# Patient Record
Sex: Male | Born: 1970 | Race: Black or African American | Hispanic: No | Marital: Single | Smoking: Current every day smoker
Health system: Southern US, Community
[De-identification: ages and names within clinical notes are randomized; demographics above are authoritative.]

## PROBLEM LIST (undated history)

## (undated) DIAGNOSIS — W3400XA Accidental discharge from unspecified firearms or gun, initial encounter: Secondary | ICD-10-CM

## (undated) DIAGNOSIS — H547 Unspecified visual loss: Secondary | ICD-10-CM

## (undated) HISTORY — PX: COLON SURGERY: SHX602

---

## 2010-08-01 ENCOUNTER — Emergency Department: Payer: Self-pay | Admitting: Emergency Medicine

## 2016-11-08 ENCOUNTER — Emergency Department: Payer: Medicaid Other

## 2016-11-08 ENCOUNTER — Encounter: Payer: Self-pay | Admitting: Emergency Medicine

## 2016-11-08 ENCOUNTER — Emergency Department
Admission: EM | Admit: 2016-11-08 | Discharge: 2016-11-09 | Disposition: A | Discharge status: Home / Self Care | Payer: Medicaid Other | Attending: Emergency Medicine | Admitting: Emergency Medicine

## 2016-11-08 DIAGNOSIS — R1084 Generalized abdominal pain: Secondary | ICD-10-CM | POA: Insufficient documentation

## 2016-11-08 DIAGNOSIS — R109 Unspecified abdominal pain: Secondary | ICD-10-CM | POA: Diagnosis present

## 2016-11-08 DIAGNOSIS — R079 Chest pain, unspecified: Secondary | ICD-10-CM | POA: Diagnosis not present

## 2016-11-08 DIAGNOSIS — F17200 Nicotine dependence, unspecified, uncomplicated: Secondary | ICD-10-CM | POA: Insufficient documentation

## 2016-11-08 LAB — TROPONIN I: Troponin I: <0.03 ng/mL (ref <0.03)

## 2016-11-08 LAB — CBC
HEMATOCRIT: 43.2 % (ref 40.0–52.0)
Hemoglobin: 15.3 g/dL (ref 13.0–18.0)
MCH: 33.1 pg (ref 26.0–34.0)
MCHC: 35.5 g/dL (ref 32.0–36.0)
MCV: 93.2 fL (ref 80.0–100.0)
Platelets: 232 10*3/uL (ref 150–440)
RBC: 4.64 MIL/uL (ref 4.40–5.90)
RDW: 12.7 % (ref 11.5–14.5)
WBC: 11.3 10*3/uL — ABNORMAL HIGH (ref 3.8–10.6)

## 2016-11-08 LAB — LIPASE, BLOOD: LIPASE: 39 U/L (ref 11–51)

## 2016-11-08 MED ORDER — MORPHINE SULFATE (PF) 4 MG/ML IV SOLN
INTRAVENOUS | Status: AC
  Administered 2016-11-08: 2 mg
  Filled 2016-11-08: qty 1

## 2016-11-08 MED ORDER — MORPHINE SULFATE (PF) 2 MG/ML IV SOLN: 2.0000 mg | Freq: Once | INTRAVENOUS | Status: DC

## 2016-11-08 MED ORDER — ONDANSETRON HCL 4 MG/2ML IJ SOLN
4.0000 mg | Freq: Once | INTRAMUSCULAR | Status: AC
  Administered 2016-11-08: 4 mg via INTRAVENOUS

## 2016-11-08 MED ORDER — ONDANSETRON HCL 4 MG/2ML IJ SOLN
INTRAMUSCULAR | Status: AC
  Administered 2016-11-08: 4 mg via INTRAVENOUS
  Filled 2016-11-08: qty 2
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
## 2016-11-08 NOTE — ED Notes (Signed)
X RAY at bedside 

## 2016-11-08 NOTE — ED Triage Notes (Signed)
Pt arrived via ems with complaints of chest pain that began at 2230 tonight. EMS gave 324 mg of aspirin and 1 sublingual NTG. Pt it alert and oriented upon arrival.

## 2016-11-09 ENCOUNTER — Encounter: Payer: Self-pay | Admitting: Radiology

## 2016-11-09 ENCOUNTER — Emergency Department: Payer: Medicaid Other

## 2016-11-09 LAB — COMPREHENSIVE METABOLIC PANEL
ALBUMIN: 3.6 g/dL (ref 3.5–5.0)
ALT: 11 U/L — ABNORMAL LOW (ref 17–63)
ANION GAP: 7 (ref 5–15)
AST: 14 U/L — ABNORMAL LOW (ref 15–41)
Alkaline Phosphatase: 38 U/L (ref 38–126)
BUN: 14 mg/dL (ref 6–20)
CO2: 21 mmol/L — AB (ref 22–32)
Calcium: 9 mg/dL (ref 8.9–10.3)
Chloride: 108 mmol/L (ref 101–111)
Creatinine, Ser: 0.86 mg/dL (ref 0.61–1.24)
GFR calc Af Amer: >60 mL/min (ref >60)
GFR calc non Af Amer: >60 mL/min (ref >60)
GLUCOSE: 90 mg/dL (ref 65–99)
POTASSIUM: 3.8 mmol/L (ref 3.5–5.1)
SODIUM: 136 mmol/L (ref 135–145)
Total Bilirubin: 0.6 mg/dL (ref 0.3–1.2)
Total Protein: 7 g/dL (ref 6.5–8.1)

## 2016-11-09 LAB — TROPONIN I: Troponin I: <0.03 ng/mL (ref <0.03)

## 2016-11-09 MED ORDER — OXYCODONE-ACETAMINOPHEN 5-325 MG PO TABS: 1.0000 | ORAL_TABLET | Freq: Four times a day (QID) | ORAL | 0 refills | Status: DC | PRN

## 2016-11-09 MED ORDER — IOPAMIDOL (ISOVUE-370) INJECTION 76%
125.0000 mL | Freq: Once | INTRAVENOUS | Status: AC | PRN
  Administered 2016-11-09: 125 mL via INTRAVENOUS

## 2016-11-09 NOTE — ED Notes (Signed)
Patient asked for something to eat, and I took him a [employer] plannerphone charger cable since he uses it for his handicap.

## 2017-03-18 ENCOUNTER — Emergency Department
Admission: EM | Admit: 2017-03-18 | Discharge: 2017-03-18 | Disposition: A | Discharge status: Home / Self Care | Payer: Medicaid Other | Attending: Emergency Medicine | Admitting: Emergency Medicine

## 2017-03-18 ENCOUNTER — Emergency Department: Payer: Medicaid Other

## 2017-03-18 DIAGNOSIS — M25511 Pain in right shoulder: Secondary | ICD-10-CM | POA: Diagnosis present

## 2017-03-18 DIAGNOSIS — S4991XA Unspecified injury of right shoulder and upper arm, initial encounter: Secondary | ICD-10-CM

## 2017-03-18 DIAGNOSIS — F17200 Nicotine dependence, unspecified, uncomplicated: Secondary | ICD-10-CM | POA: Insufficient documentation

## 2017-03-18 MED ORDER — MORPHINE SULFATE (PF) 2 MG/ML IV SOLN
2.0000 mg | Freq: Once | INTRAVENOUS | Status: AC
  Administered 2017-03-18: 2 mg via INTRAVENOUS
  Filled 2017-03-18: qty 1

## 2017-03-18 MED ORDER — IBUPROFEN 800 MG PO TABS: 800.0000 mg | ORAL_TABLET | Freq: Three times a day (TID) | ORAL | 0 refills | Status: DC | PRN

## 2017-03-18 MED ORDER — ONDANSETRON HCL 4 MG/2ML IJ SOLN
4.0000 mg | Freq: Once | INTRAMUSCULAR | Status: AC
  Administered 2017-03-18: 4 mg via INTRAVENOUS
  Filled 2017-03-18: qty 2

## 2017-03-18 NOTE — ED Triage Notes (Signed)
Pt to ED from home via [facility] with c/o RIGHT shoulder pain following an injury while wrestling with his son. EMS reports no obvious deformity; CMS intact. Dr Manson PasseyBrown at bedside upon pt's arrival to ED. Pt is A&O, in NAD; RR even, regular, and unlabored.

## 2017-03-18 NOTE — ED Provider Notes (Signed)
[facility] Emergency Department Provider Note    First MD Initiated Contact with Patient 03/18/17 [phone]     (approximate)  I have reviewed the triage vital signs and the nursing notes.   HISTORY  Chief Complaint Shoulder Injury   HPI Jeremiah Bright is a 46 y.o. male S emergency department with 10 out of 10 right shoulder pain since 5 PM yesterday. Patient states that he was playing with his son who fell on top of him followed by resultant pain in his right shoulder. Patient states that the pain is worse with any movement 8 out of 10 without movement 10 out of 10 with movement. Patient denies any head injury no loss of consciousness.     Past medical history None  There are no active problems to display for this patient.   Past Surgical History:  Procedure Laterality Date  . COLON SURGERY      Prior to Admission medications   Medication Sig Start Date End Date Taking? Authorizing Provider  ibuprofen (ADVIL,MOTRIN) 800 MG tablet Take 1 tablet (800 mg total) by mouth every 8 (eight) hours as needed. 03/18/17   Darci CurrentBrown, [place] N, MD  oxyCODONE-acetaminophen (ROXICET) 5-325 MG tablet Take 1 tablet by mouth every 6 (six) hours as needed for severe pain. 11/09/16   Darci CurrentBrown, [place] N, MD    Allergies No known drug allergies  Family history  none  Social History Social History  Substance Use Topics  . Smoking status: Current Every Day Smoker  . Smokeless tobacco: Never Used  . Alcohol use Yes    Review of Systems Constitutional: No fever/chills Eyes: No visual changes. ENT: No sore throat. Cardiovascular: Denies chest pain. Respiratory: Denies shortness of breath. Gastrointestinal: No abdominal pain.  No nausea, no vomiting.  No diarrhea.  No constipation. Genitourinary: Negative for dysuria. Musculoskeletal: Negative for neck pain.  Negative for back pain.Positive for right shoulder pain Integumentary: Negative for rash. Neurological:  Negative for headaches, focal weakness or numbness.   ____________________________________________   PHYSICAL EXAM:  VITAL SIGNS: ED Triage Vitals  Enc Vitals Group     BP 03/18/17 0510 120/90     Pulse Rate 03/18/17 0510 73     Resp 03/18/17 0510 16     Temp 03/18/17 0510 98.5 F (36.9 C)     Temp Source 03/18/17 0510 Oral     SpO2 03/18/17 0510 97 %     Weight 03/18/17 0512 99.8 kg (220 lb)     Height 03/18/17 0512 1.88 m (6\' 2" )     Head Circumference --      Peak Flow --      Pain Score 03/18/17 0510 8     Pain Loc --      Pain Edu? --      Excl. in GC? --      Constitutional: Alert and oriented. Well appearing and in no acute distress. Eyes: Conjunctivae are normal. Head: Atraumatic. Mouth/Throat: Mucous membranes are moist.  Oropharynx non-erythematous. Neck: No stridor.   Cardiovascular: Normal rate, regular rhythm. Good peripheral circulation. Grossly normal heart sounds. Respiratory: Normal respiratory effort.  No retractions. Lungs CTAB. Gastrointestinal: Soft and nontender. No distention.  Musculoskeletal: Right shoulder  pain with active and passive range of motion.  Neurologic:  Normal speech and language. No gross focal neurologic deficits are appreciated.  Skin:  Skin is warm, dry and intact. No rash noted. Psychiatric: Mood and affect are normal. Speech and behavior are normal.   RADIOLOGY  I, [place] N Casha Estupinan, personally viewed and evaluated these images (plain radiographs) as part of my medical decision making, as well as reviewing the written report by the radiologist.  Dg Shoulder Right  Result Date: 03/18/2017 CLINICAL DATA:  Shoulder pain EXAM: RIGHT SHOULDER - 2+ VIEW COMPARISON:  Chest radiograph 11/08/2016 FINDINGS: There is no evidence of fracture or dislocation. Hypertrophic appearance of the distal right clavicle is unchanged. There is no evidence of arthropathy or other focal bone abnormality. Soft tissues are unremarkable. IMPRESSION: No  acute abnormality of the right shoulder. Electronically Signed   By: Deatra Robinson M.D.   On: 03/18/2017 05:42     Procedures   ____________________________________________   INITIAL IMPRESSION / ASSESSMENT AND PLAN / ED COURSE  Pertinent labs & imaging results that were available during my care of the patient were reviewed by me and considered in my medical decision making (see chart for details).  Patient given 2 mg of IV morphine on arrival to the emergency department secondary to pain. X-ray revealed no evidence of fracture or dislocation. On reevaluation patient able to AB duct is arm to approximately 120. Sling applied patient advised to follow-up with orthopedic surgery if pain does not improve.      ____________________________________________  FINAL CLINICAL IMPRESSION(S) / ED DIAGNOSES  Final diagnoses:  Right shoulder injury, initial encounter     MEDICATIONS GIVEN DURING THIS VISIT:  Medications  morphine 2 MG/ML injection 2 mg (2 mg Intravenous Given 03/18/17 0532)  ondansetron (ZOFRAN) injection 4 mg (4 mg Intravenous Given 03/18/17 0532)     NEW OUTPATIENT MEDICATIONS STARTED DURING THIS VISIT:  New Prescriptions   IBUPROFEN (ADVIL,MOTRIN) 800 MG TABLET    Take 1 tablet (800 mg total) by mouth every 8 (eight) hours as needed.    Modified Medications   No medications on file    Discontinued Medications   No medications on file     Note:  This document was prepared using Dragon voice recognition software and may include unintentional dictation errors.    Darci Current, MD 03/18/17 [phone]

## 2017-03-18 NOTE — ED Notes (Signed)
Pt arrived EMS and reports no friends or family available for transportation home. 2 bus passes given (1 for pt and 1 for his 48 y/o son).

## 2017-04-26 ENCOUNTER — Ambulatory Visit: Payer: Medicaid Other | Attending: Orthopedic Surgery | Admitting: Physical Therapy

## 2017-07-05 ENCOUNTER — Emergency Department (HOSPITAL_COMMUNITY)
Admission: EM | Admit: 2017-07-05 | Discharge: 2017-07-05 | Disposition: A | Discharge status: Home / Self Care | Payer: Medicaid Other | Attending: Emergency Medicine | Admitting: Emergency Medicine

## 2017-07-05 ENCOUNTER — Emergency Department (HOSPITAL_COMMUNITY): Payer: Medicaid Other

## 2017-07-05 ENCOUNTER — Encounter (HOSPITAL_COMMUNITY): Payer: Self-pay | Admitting: Emergency Medicine

## 2017-07-05 DIAGNOSIS — M7918 Myalgia, other site: Secondary | ICD-10-CM | POA: Diagnosis not present

## 2017-07-05 DIAGNOSIS — M79645 Pain in left finger(s): Secondary | ICD-10-CM | POA: Insufficient documentation

## 2017-07-05 DIAGNOSIS — F17200 Nicotine dependence, unspecified, uncomplicated: Secondary | ICD-10-CM | POA: Diagnosis not present

## 2017-07-05 HISTORY — DX: Unspecified visual loss: H54.7

## 2017-07-05 MED ORDER — IBUPROFEN 600 MG PO TABS: 600.0000 mg | ORAL_TABLET | Freq: Four times a day (QID) | ORAL | 0 refills | Status: DC | PRN
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
## 2017-07-05 NOTE — ED Provider Notes (Signed)
[facility]-EMERGENCY DEPT Provider Note   CSN: [id number] Arrival date & time: 07/05/17  1051     History   Chief Complaint Chief Complaint  Patient presents with  . Finger Injury    HPI Jeremiah Bright is a 46 y.o. male.  HPI  46 y.o. male, presents to the Emergency Department today due to left finger injuries. Notes on 3rd, 4th, and 5th digit. States that he got fingers stuck in a machine press 1 hour PTA. Fingers underneath for 30 seconds. Brought in by EMS. Fentanyl given en route. Denies numbness/tingling. No open wounds or bleeding. No deformities. No other symptoms noted.   Past Medical History:  Diagnosis Date  . Sight impaired     There are no active problems to display for this patient.   Past Surgical History:  Procedure Laterality Date  . COLON SURGERY         Home Medications    Prior to Admission medications   Medication Sig Start Date End Date Taking? Authorizing Provider  ibuprofen (ADVIL,MOTRIN) 800 MG tablet Take 1 tablet (800 mg total) by mouth every 8 (eight) hours as needed. 03/18/17   Darci CurrentBrown, Fayette N, MD  oxyCODONE-acetaminophen (ROXICET) 5-325 MG tablet Take 1 tablet by mouth every 6 (six) hours as needed for severe pain. 11/09/16   Darci CurrentBrown, Willits N, MD    Family History History reviewed. No pertinent family history.  Social History Social History  Substance Use Topics  . Smoking status: Current Every Day Smoker  . Smokeless tobacco: Never Used  . Alcohol use Yes     Allergies   Patient has no known allergies.   Review of Systems Review of Systems ROS reviewed and all are negative for acute change except as noted in the HPI.  Physical Exam Updated Vital Signs BP 118/80   Pulse (!) 58   Temp 97.7 F (36.5 C) (Oral)   Resp 16   SpO2 98%   Physical Exam  Constitutional: He is oriented to person, place, and time. Vital signs are normal. He appears well-developed and well-nourished.  HENT:  Head:  Normocephalic.  Right Ear: Hearing normal.  Left Ear: Hearing normal.  Eyes: Pupils are equal, round, and reactive to light. Conjunctivae and EOM are normal.  Cardiovascular: Normal rate and regular rhythm.   Pulmonary/Chest: Effort normal.  Musculoskeletal:  Left 3, 4, 5 digits without deformity. NVI. Grip strength intact. Able to close fist. Mild superficial abrasion noted.   Neurological: He is alert and oriented to person, place, and time.  Skin: Skin is warm and dry.  Psychiatric: He has a normal mood and affect. His speech is normal and behavior is normal. Thought content normal.  Nursing note and vitals reviewed.    ED Treatments / Results  Labs (all labs ordered are listed, but only abnormal results are displayed) Labs Reviewed - No data to display  EKG  EKG Interpretation None       Radiology Dg Finger Little Left  Result Date: 07/05/2017 CLINICAL DATA:  Crush injury EXAM: LEFT LITTLE FINGER 2+V COMPARISON:  None. FINDINGS: There is no evidence of fracture or dislocation. There is no evidence of arthropathy or other focal bone abnormality. Soft tissues are unremarkable. IMPRESSION: Negative. Electronically Signed   By: Charlett NoseKevin  Dover M.D.   On: 07/05/2017 11:44   Dg Finger Middle Left  Result Date: 07/05/2017 CLINICAL DATA:  Finger injury. EXAM: LEFT MIDDLE FINGER 2+V COMPARISON:  None. FINDINGS: There is no evidence of fracture  or dislocation. There is no evidence of arthropathy or other focal bone abnormality. Soft tissues are unremarkable. IMPRESSION: Negative. Electronically Signed   By: Charlett Nose M.D.   On: 07/05/2017 11:43   Dg Finger Ring Left  Result Date: 07/05/2017 CLINICAL DATA:  Crush injury EXAM: LEFT RING FINGER 2+V COMPARISON:  None. FINDINGS: Degenerative joint disease changes at the left ring finger DIP joint with apparent fusion across the joint space. No fracture, subluxation or dislocation. IMPRESSION: Degenerative changes and apparent fusion  across the left ring finger DIP joint. No acute bony abnormality. Electronically Signed   By: Charlett Nose M.D.   On: 07/05/2017 11:44    Procedures Procedures (including critical care time)  Medications Ordered in ED Medications - No data to display   Initial Impression / Assessment and Plan / ED Course  I have reviewed the triage vital signs and the nursing notes.  Pertinent labs & imaging results that were available during my care of the patient were reviewed by me and considered in my medical decision making (see chart for details).  Final Clinical Impressions(s) / ED Diagnoses   {I have reviewed and evaluated the relevant imaging studies.  {I have reviewed the relevant previous healthcare records.  {I obtained HPI from historian.   ED Course:  Assessment: Patient X-Ray negative for obvious fracture or dislocation. Likely contusion. Pt advised to follow up with PCP. Conservative therapy recommended and discussed. Patient will be discharged home & is agreeable with above plan. Returns precautions discussed. Pt appears safe for discharge  Disposition/Plan:  DC Home Additional Verbal discharge instructions given and discussed with patient.  Pt Instructed to f/u with PCP in the next week for evaluation and treatment of symptoms. Return precautions given Pt acknowledges and agrees with plan  Supervising Physician Bethann Berkshire, MD  Final diagnoses:  Pain of finger of left hand  Musculoskeletal pain    New Prescriptions New Prescriptions   No medications on file     Audry Pili, Cordelia Poche 07/05/17 1159    Bethann Berkshire, MD 07/06/17 [phone]

## 2017-07-05 NOTE — ED Notes (Signed)
Pt in xray

## 2017-07-05 NOTE — ED Notes (Signed)
Bed: [facility] Expected date:  Expected time:  Means of arrival:  Comments: EMS-finger injury 

## 2017-07-05 NOTE — ED Notes (Signed)
ED Provider at bedside. 

## 2019-02-07 ENCOUNTER — Emergency Department
Admission: EM | Admit: 2019-02-07 | Discharge: 2019-02-07 | Disposition: A | Discharge status: Home / Self Care | Payer: Medicaid Other | Attending: Emergency Medicine | Admitting: Emergency Medicine

## 2019-02-07 ENCOUNTER — Other Ambulatory Visit: Payer: Self-pay

## 2019-02-07 ENCOUNTER — Emergency Department: Payer: Medicaid Other

## 2019-02-07 DIAGNOSIS — R1010 Upper abdominal pain, unspecified: Secondary | ICD-10-CM | POA: Diagnosis not present

## 2019-02-07 DIAGNOSIS — F17200 Nicotine dependence, unspecified, uncomplicated: Secondary | ICD-10-CM | POA: Diagnosis not present

## 2019-02-07 DIAGNOSIS — R112 Nausea with vomiting, unspecified: Secondary | ICD-10-CM | POA: Diagnosis not present

## 2019-02-07 HISTORY — DX: Accidental discharge from unspecified firearms or gun, initial encounter: W34.00XA

## 2019-02-07 LAB — COMPREHENSIVE METABOLIC PANEL
ALT: 13 U/L (ref 0–44)
AST: 17 U/L (ref 15–41)
Albumin: 4.4 g/dL (ref 3.5–5.0)
Alkaline Phosphatase: 47 U/L (ref 38–126)
Anion gap: 10 (ref 5–15)
BUN: 14 mg/dL (ref 6–20)
CO2: 21 mmol/L — ABNORMAL LOW (ref 22–32)
Calcium: 9.3 mg/dL (ref 8.9–10.3)
Chloride: 110 mmol/L (ref 98–111)
Creatinine, Ser: 1.02 mg/dL (ref 0.61–1.24)
GFR calc Af Amer: >60 mL/min (ref >60)
GFR calc non Af Amer: >60 mL/min (ref >60)
Glucose, Bld: 121 mg/dL — ABNORMAL HIGH (ref 70–99)
Potassium: 3.5 mmol/L (ref 3.5–5.1)
Sodium: 141 mmol/L (ref 135–145)
Total Bilirubin: 0.4 mg/dL (ref 0.3–1.2)
Total Protein: 8 g/dL (ref 6.5–8.1)

## 2019-02-07 LAB — CBC
HCT: 51.6 % (ref 39.0–52.0)
Hemoglobin: 17.8 g/dL — ABNORMAL HIGH (ref 13.0–17.0)
MCH: 32.7 pg (ref 26.0–34.0)
MCHC: 34.5 g/dL (ref 30.0–36.0)
MCV: 94.7 fL (ref 80.0–100.0)
Platelets: 253 10*3/uL (ref 150–400)
RBC: 5.45 MIL/uL (ref 4.22–5.81)
RDW: 12.1 % (ref 11.5–15.5)
WBC: 11.3 10*3/uL — ABNORMAL HIGH (ref 4.0–10.5)
nRBC: 0 % (ref 0.0–0.2)

## 2019-02-07 LAB — LIPASE, BLOOD: Lipase: 33 U/L (ref 11–51)

## 2019-02-07 MED ORDER — IOHEXOL 300 MG/ML  SOLN
100.0000 mL | Freq: Once | INTRAMUSCULAR | Status: AC | PRN
  Administered 2019-02-07: 100 mL via INTRAVENOUS

## 2019-02-07 MED ORDER — SODIUM CHLORIDE 0.9% FLUSH: 3.0000 mL | Freq: Once | INTRAVENOUS | Status: DC

## 2019-02-07 MED ORDER — ONDANSETRON 4 MG PO TBDP
4.0000 mg | ORAL_TABLET | Freq: Once | ORAL | Status: AC
  Administered 2019-02-07: 4 mg via ORAL
  Filled 2019-02-07: qty 1

## 2019-02-07 MED ORDER — ONDANSETRON HCL 4 MG/2ML IJ SOLN
4.0000 mg | Freq: Once | INTRAMUSCULAR | Status: AC
  Administered 2019-02-07: 4 mg via INTRAVENOUS
  Filled 2019-02-07: qty 2

## 2019-02-07 MED ORDER — MORPHINE SULFATE (PF) 4 MG/ML IV SOLN
4.0000 mg | Freq: Once | INTRAVENOUS | Status: AC
  Administered 2019-02-07: 4 mg via INTRAVENOUS
  Filled 2019-02-07: qty 1

## 2019-02-07 NOTE — ED Triage Notes (Signed)
Pt c/o sudden onset mid abd pain with N/V that started a couple of hours ago. States he ate alfredo at work today. Pt actively vomiting in the WR>

## 2019-02-07 NOTE — ED Notes (Signed)
This RN to contact [employer] for transport home.

## 2019-02-07 NOTE — ED Notes (Signed)
Pt unable to contact ride at this time. States that he will try to contact another person for transport.
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
## 2019-12-03 ENCOUNTER — Ambulatory Visit: Payer: Medicaid Other | Attending: Internal Medicine

## 2019-12-03 DIAGNOSIS — Z23 Encounter for immunization: Secondary | ICD-10-CM

## 2019-12-03 NOTE — Progress Notes (Signed)
   Covid-19 Vaccination Clinic  Name:  Darrly Loberg    MRN: [id number] DOB: December 07, 1970  12/03/2019  Mr. Staniszewski was observed post Covid-19 immunization for 15 minutes without incident. He was provided with Vaccine Information Sheet and instruction to access the V-Safe system.   Mr. Mcphillips was instructed to call 911 with any severe reactions post vaccine: Marland Kitchen Difficulty breathing  . Swelling of face and throat  . A fast heartbeat  . A bad rash all over body  . Dizziness and weakness   Immunizations Administered    Name Date Dose VIS Date Route   Pfizer COVID-19 Vaccine 12/03/2019  9:39 AM 0.3 mL 08/24/2019 Intramuscular   Manufacturer: [employer], [employer]   Lot: [id number]   NDC: [address]-4

## 2019-12-31 ENCOUNTER — Ambulatory Visit: Payer: Medicaid Other | Attending: Internal Medicine

## 2019-12-31 DIAGNOSIS — Z23 Encounter for immunization: Secondary | ICD-10-CM

## 2019-12-31 NOTE — Progress Notes (Addendum)
At 0937 during the 15 minute post Covid 19 2nd vaccine observation, pt knelt to floor c/o headache. Pt was noted to be clammy and diaphoretic. Pt assisted back to the chair. Another nurse sent for VS monitor. 0938 BP 121/84. P105 RR21 pulse ox 100%. Pt stated he had no medical problems and no history of issues w/previous vaccine. Consulting civil engineer notified. Pt state he was thirsty and had not eat today. Given water and crackers. EMT, Minerva Areola came to evaluate pt. At 0949, Pt more uncomfortable, moving about seat, c/o right sided eye pain and headache that would not go away. He had eat a cookie and drank 2 20 oz waters. BP 142/113. P 87. Minerva Areola, EMT placed Jeremiah Bright in the wheelchair to allow him to lie down in the EMT observation area to rest. Charge RN notified. At 1047 after pt rested in quiet dark environment on stretcher under care of the [place] EMT, Minerva Areola, the pt felt well enough to walk to [employer] [place] waiting a the front where a driver was waiting and would escort him to work. Discharge instructions were given regarding signs/symptoms of when to call 911 for adverse reactions. The pt told the EMT he felt well enough and wanted to go to work. Charge RN reviewed the case and spoke to the pt and EMT.   Covid-19 Vaccination Clinic  Name:  Jeremiah Bright    MRN: [id number] DOB: 09-24-1970  12/31/2019  Jeremiah Bright was observed post Covid-19 immunization for 15 minutes .  During the observation period, he experienced an adverse reaction with the following symptoms:  headache.  Assessment : Time of assessment [phone]. Anxious, Diaphoretic and Clammy.  Actions taken:  Vitals sign taken  EMS on site and hand off completed to Paramedic (Name ERIC). VAERS form completed Allergy documented  There were no vitals filed for this visit. SEE ABove note for vital signs. Pt monitored BY EMS on stretcher for 60 minutes.  Medications administered: No medication administered.  Disposition: Reports no  further symptoms of adverse reaction after observation for 70 minutes. Discharged home. Instructed to call 911 for trouble breathing, rapid heart rate, dizziness, swelling of tongue or throat.   Immunizations Administered    Name Date Dose VIS Date Route   Pfizer COVID-19 Vaccine 12/31/2019  9:22 AM 0.3 mL 11/07/2018 Intramuscular   Manufacturer: [employer], [employer]   Lot: [id number]   NDC: [address]-2

## 2020-01-03 ENCOUNTER — Telehealth: Payer: Self-pay | Admitting: *Deleted

## 2020-01-03 NOTE — Telephone Encounter (Signed)
Called and spoke with patient in regards to his second COVID vaccine that he received on 12/31/2019. He stated that he felt fine the next day and the pain above his right eye had gone away. Advised to patient that now he is fully vaccinated he is ok and that when it comes time to get a COVID booster to please call his PCP or call our office with recommendations before hand. Patient verbalized understanding.

## 2022-01-24 ENCOUNTER — Observation Stay
Admission: EM | Admit: 2022-01-24 | Discharge: 2022-01-25 | Disposition: A | Discharge status: Home Health | Payer: Medicaid Other | Attending: Osteopathic Medicine | Admitting: Osteopathic Medicine

## 2022-01-24 ENCOUNTER — Emergency Department: Payer: Medicaid Other

## 2022-01-24 ENCOUNTER — Encounter: Payer: Self-pay | Admitting: Internal Medicine

## 2022-01-24 DIAGNOSIS — H547 Unspecified visual loss: Secondary | ICD-10-CM

## 2022-01-24 DIAGNOSIS — R7309 Other abnormal glucose: Secondary | ICD-10-CM | POA: Diagnosis present

## 2022-01-24 DIAGNOSIS — Z720 Tobacco use: Secondary | ICD-10-CM | POA: Diagnosis present

## 2022-01-24 DIAGNOSIS — Z20822 Contact with and (suspected) exposure to covid-19: Secondary | ICD-10-CM | POA: Insufficient documentation

## 2022-01-24 DIAGNOSIS — R0789 Other chest pain: Principal | ICD-10-CM | POA: Insufficient documentation

## 2022-01-24 DIAGNOSIS — F17210 Nicotine dependence, cigarettes, uncomplicated: Secondary | ICD-10-CM | POA: Diagnosis not present

## 2022-01-24 DIAGNOSIS — R079 Chest pain, unspecified: Secondary | ICD-10-CM | POA: Diagnosis present

## 2022-01-24 DIAGNOSIS — J441 Chronic obstructive pulmonary disease with (acute) exacerbation: Secondary | ICD-10-CM

## 2022-01-24 DIAGNOSIS — E876 Hypokalemia: Secondary | ICD-10-CM | POA: Diagnosis not present

## 2022-01-24 DIAGNOSIS — R0602 Shortness of breath: Secondary | ICD-10-CM | POA: Diagnosis present

## 2022-01-24 LAB — CBC WITH DIFFERENTIAL/PLATELET
Abs Immature Granulocytes: 0.02 10*3/uL (ref 0.00–0.07)
Basophils Absolute: 0 10*3/uL (ref 0.0–0.1)
Basophils Relative: 0 %
Eosinophils Absolute: 0 10*3/uL (ref 0.0–0.5)
Eosinophils Relative: 1 %
HCT: 45.1 % (ref 39.0–52.0)
Hemoglobin: 15.7 g/dL (ref 13.0–17.0)
Immature Granulocytes: 0 %
Lymphocytes Relative: 18 %
Lymphs Abs: 1.2 10*3/uL (ref 0.7–4.0)
MCH: 33.3 pg (ref 26.0–34.0)
MCHC: 34.8 g/dL (ref 30.0–36.0)
MCV: 95.8 fL (ref 80.0–100.0)
Monocytes Absolute: 0.3 10*3/uL (ref 0.1–1.0)
Monocytes Relative: 5 %
Neutro Abs: 5 10*3/uL (ref 1.7–7.7)
Neutrophils Relative %: 76 %
Platelets: 202 10*3/uL (ref 150–400)
RBC: 4.71 MIL/uL (ref 4.22–5.81)
RDW: 11.9 % (ref 11.5–15.5)
WBC: 6.6 10*3/uL (ref 4.0–10.5)
nRBC: 0 % (ref 0.0–0.2)

## 2022-01-24 LAB — BASIC METABOLIC PANEL
Anion gap: 6 (ref 5–15)
BUN: 10 mg/dL (ref 6–20)
CO2: 22 mmol/L (ref 22–32)
Calcium: 8.4 mg/dL — ABNORMAL LOW (ref 8.9–10.3)
Chloride: 110 mmol/L (ref 98–111)
Creatinine, Ser: 1.04 mg/dL (ref 0.61–1.24)
GFR, Estimated: >60 mL/min (ref >60)
Glucose, Bld: 128 mg/dL — ABNORMAL HIGH (ref 70–99)
Potassium: 3.3 mmol/L — ABNORMAL LOW (ref 3.5–5.1)
Sodium: 138 mmol/L (ref 135–145)

## 2022-01-24 LAB — TROPONIN I (HIGH SENSITIVITY)
Troponin I (High Sensitivity): 2 ng/L (ref <18)
Troponin I (High Sensitivity): 3 ng/L (ref <18)

## 2022-01-24 LAB — TSH: TSH: 0.172 u[IU]/mL — ABNORMAL LOW (ref 0.350–4.500)

## 2022-01-24 LAB — RESP PANEL BY RT-PCR (FLU A&B, COVID) ARPGX2
Influenza A by PCR: NEGATIVE
Influenza B by PCR: NEGATIVE
SARS Coronavirus 2 by RT PCR: NEGATIVE

## 2022-01-24 LAB — T4, FREE: Free T4: 1.16 ng/dL — ABNORMAL HIGH (ref 0.61–1.12)

## 2022-01-24 MED ORDER — POLYETHYLENE GLYCOL 3350 17 G PO PACK: 17.0000 g | PACK | Freq: Every day | ORAL | Status: DC | PRN

## 2022-01-24 MED ORDER — ACETAMINOPHEN 500 MG PO TABS
1000.0000 mg | ORAL_TABLET | Freq: Once | ORAL | Status: AC
  Administered 2022-01-24: 1000 mg via ORAL
  Filled 2022-01-24: qty 2

## 2022-01-24 MED ORDER — ONDANSETRON HCL 4 MG PO TABS: 4.0000 mg | ORAL_TABLET | Freq: Four times a day (QID) | ORAL | Status: DC | PRN

## 2022-01-24 MED ORDER — LACTATED RINGERS IV SOLN: INTRAVENOUS | Status: AC

## 2022-01-24 MED ORDER — POTASSIUM CHLORIDE CRYS ER 20 MEQ PO TBCR
20.0000 meq | EXTENDED_RELEASE_TABLET | Freq: Once | ORAL | Status: AC
  Administered 2022-01-24: 20 meq via ORAL
  Filled 2022-01-24: qty 1

## 2022-01-24 MED ORDER — BISACODYL 5 MG PO TBEC: 5.0000 mg | DELAYED_RELEASE_TABLET | Freq: Every day | ORAL | Status: DC | PRN

## 2022-01-24 MED ORDER — GUAIFENESIN ER 600 MG PO TB12: 600.0000 mg | ORAL_TABLET | Freq: Two times a day (BID) | ORAL | Status: DC | PRN

## 2022-01-24 MED ORDER — DOCUSATE SODIUM 100 MG PO CAPS
100.0000 mg | ORAL_CAPSULE | Freq: Two times a day (BID) | ORAL | Status: DC
  Filled 2022-01-24 (×2): qty 1

## 2022-01-24 MED ORDER — IPRATROPIUM-ALBUTEROL 0.5-2.5 (3) MG/3ML IN SOLN
3.0000 mL | Freq: Four times a day (QID) | RESPIRATORY_TRACT | Status: DC
  Administered 2022-01-24 – 2022-01-25 (×3): 3 mL via RESPIRATORY_TRACT
  Filled 2022-01-24 (×3): qty 3

## 2022-01-24 MED ORDER — NICOTINE 14 MG/24HR TD PT24
14.0000 mg | MEDICATED_PATCH | Freq: Every day | TRANSDERMAL | Status: DC
  Filled 2022-01-24: qty 1

## 2022-01-24 MED ORDER — ONDANSETRON HCL 4 MG/2ML IJ SOLN
4.0000 mg | Freq: Four times a day (QID) | INTRAMUSCULAR | Status: DC | PRN
Start: 2022-01-24 — End: 2022-01-25

## 2022-01-24 MED ORDER — ACETAMINOPHEN 325 MG RE SUPP: 650.0000 mg | Freq: Four times a day (QID) | RECTAL | Status: DC | PRN

## 2022-01-24 MED ORDER — ACETAMINOPHEN 325 MG PO TABS
650.0000 mg | ORAL_TABLET | Freq: Four times a day (QID) | ORAL | Status: DC | PRN
  Filled 2022-01-24: qty 2

## 2022-01-24 MED ORDER — IPRATROPIUM-ALBUTEROL 0.5-2.5 (3) MG/3ML IN SOLN
3.0000 mL | Freq: Once | RESPIRATORY_TRACT | Status: AC
  Administered 2022-01-24: 3 mL via RESPIRATORY_TRACT
  Filled 2022-01-24: qty 3

## 2022-01-24 MED ORDER — ALBUTEROL SULFATE (2.5 MG/3ML) 0.083% IN NEBU
2.5000 mg | INHALATION_SOLUTION | Freq: Once | RESPIRATORY_TRACT | Status: AC
Start: 2022-01-24 — End: 2022-01-24
  Administered 2022-01-24: 2.5 mg via RESPIRATORY_TRACT
  Filled 2022-01-24: qty 3

## 2022-01-24 MED ORDER — SODIUM CHLORIDE 0.9% FLUSH
3.0000 mL | Freq: Two times a day (BID) | INTRAVENOUS | Status: DC
  Administered 2022-01-24 – 2022-01-25 (×2): 3 mL via INTRAVENOUS

## 2022-01-24 MED ORDER — HEPARIN SODIUM (PORCINE) 5000 UNIT/ML IJ SOLN
5000.0000 [IU] | Freq: Three times a day (TID) | INTRAMUSCULAR | Status: DC
  Administered 2022-01-24 – 2022-01-25 (×2): 5000 [IU] via SUBCUTANEOUS
  Filled 2022-01-24 (×2): qty 1

## 2022-01-24 MED ORDER — PREDNISONE 20 MG PO TABS: 40.0000 mg | ORAL_TABLET | Freq: Every day | ORAL | Status: DC

## 2022-01-24 MED ORDER — PANTOPRAZOLE SODIUM 40 MG IV SOLR
40.0000 mg | Freq: Two times a day (BID) | INTRAVENOUS | Status: DC
  Administered 2022-01-24 – 2022-01-25 (×2): 40 mg via INTRAVENOUS
  Filled 2022-01-24 (×2): qty 10

## 2022-01-24 MED ORDER — ALBUTEROL SULFATE (2.5 MG/3ML) 0.083% IN NEBU: 2.5000 mg | INHALATION_SOLUTION | RESPIRATORY_TRACT | Status: DC | PRN

## 2022-01-24 MED ORDER — METHYLPREDNISOLONE SODIUM SUCC 125 MG IJ SOLR
60.0000 mg | Freq: Two times a day (BID) | INTRAMUSCULAR | Status: AC
  Administered 2022-01-24 – 2022-01-25 (×2): 60 mg via INTRAVENOUS
  Filled 2022-01-24 (×2): qty 2
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
## 2022-01-24 NOTE — ED Provider Notes (Signed)
? ?[facility] ?Provider Note ? ? ? Event Date/Time  ? First MD Initiated Contact with Patient 01/24/22 1741   ?  (approximate) ? ? ?History  ? ?Chief Complaint ?Chest Pain and Shortness of Breath (Increasing chest pain over several days, increasing SOB since last night) ? ? ?HPI ? ?Jeremiah Bright is a 51 y.o. male with past medical history of blindness who presents to the ED complaining of shortness of breath.  Patient reports that he has been dealing with a productive cough with increasing difficulty breathing for the past 3 to 4 days.  This has been associated with a feeling of tightness and pressure in his chest as well as subjective fevers, however he has been unable to check his temperature at home.  He has not noticed any pain or swelling in his legs and denies any history of DVT/PE.  He does report smoking 1 pack/day of cigarettes, dealt with similar symptoms in the past when he had a cold, but denies any prior diagnosis of asthma or COPD.  Wheezing was noted by EMS and patient was given 2 DuoNebs along with 125 mg of IV Solu-Medrol.  He now states that he is feeling slightly better but continues to feel short of breath. ?  ? ? ?Physical Exam  ? ?Triage Vital Signs: ?ED Triage Vitals  ?Enc Vitals Group  ?   BP   ?   Pulse   ?   Resp   ?   Temp   ?   Temp src   ?   SpO2   ?   Weight   ?   Height   ?   Head Circumference   ?   Peak Flow   ?   Pain Score   ?   Pain Loc   ?   Pain Edu?   ?   Excl. in [place]?   ? ? ?Most recent vital signs: ?Vitals:  ? 01/24/22 1800 01/24/22 1952  ?BP: 122/83 124/85  ?Pulse: (!) 102 96  ?Resp: (!) 22 15  ?Temp:  98.9 ?F (37.2 ?C)  ?SpO2: 95% 93%  ? ? ?Constitutional: Alert and oriented. ?Head: Atraumatic. ?Nose: No congestion/rhinnorhea. ?Mouth/Throat: Mucous membranes are moist.  ?Cardiovascular: Normal rate, regular rhythm. Grossly normal heart sounds.  2+ radial pulses bilaterally. ?Respiratory: Mildly tachypneic with increased respiratory effort.  No  retractions.  Expiratory wheezing throughout. ?Gastrointestinal: Soft and nontender. No distention. ?Musculoskeletal: No lower extremity tenderness nor edema.  ?Neurologic:  Normal speech and language. No gross focal neurologic deficits are appreciated. ? ? ? ?ED Results / Procedures / Treatments  ? ?Labs ?(all labs ordered are listed, but only abnormal results are displayed) ?Labs Reviewed  ?BASIC METABOLIC PANEL - Abnormal; Notable for the following components:  ?    Result Value  ? Potassium 3.3 (*)   ? Glucose, Bld 128 (*)   ? Calcium 8.4 (*)   ? All other components within normal limits  ?RESP PANEL BY RT-PCR (FLU A&B, COVID) ARPGX2  ?CBC WITH DIFFERENTIAL/PLATELET  ?HIV ANTIBODY (ROUTINE TESTING W REFLEX)  ?CBC  ?CREATININE, SERUM  ?TSH  ?T4, FREE  ?URINE DRUG SCREEN, QUALITATIVE ([facility] ONLY)  ?D-DIMER, QUANTITATIVE  ?TROPONIN I (HIGH SENSITIVITY)  ?TROPONIN I (HIGH SENSITIVITY)  ? ? ? ?EKG ? ?ED ECG REPORT ?Tempie Hoist, the attending physician, personally viewed and interpreted this ECG. ? ? Date: 01/24/2022 ? EKG Time: 17:49 ? Rate: 104 ? Rhythm: sinus tachycardia ? Axis: RAD ? Intervals:none ?  ST&T Change: None ? ?RADIOLOGY ?Chest x-ray reviewed and interpreted by me with no infiltrate, edema, or effusion. ? ?PROCEDURES: ? ?Critical Care performed: Yes, see critical care procedure note(s) ? ?.Critical Care ?Performed by: Blake Divine, MD ?Authorized by: Blake Divine, MD  ? ?Critical care provider statement:  ?  Critical care time (minutes):  30 ?  Critical care time was exclusive of:  Separately billable procedures and treating other patients and teaching time ?  Critical care was necessary to treat or prevent imminent or life-threatening deterioration of the following conditions:  Respiratory failure ?  Critical care was time spent personally by me on the following activities:  Development of treatment plan with patient or surrogate, discussions with consultants, evaluation of patient's response  to treatment, examination of patient, ordering and review of laboratory studies, ordering and review of radiographic studies, ordering and performing treatments and interventions, pulse oximetry, re-evaluation of patient's condition and review of old charts ?  I assumed direction of critical care for this patient from another provider in my specialty: no   ?  Care discussed with: admitting provider   ? ? ?MEDICATIONS ORDERED IN ED: ?Medications  ?methylPREDNISolone sodium succinate (SOLU-MEDROL) 125 mg/2 mL injection 60 mg (has no administration in time range)  ?  Followed by  ?predniSONE (DELTASONE) tablet 40 mg (has no administration in time range)  ?ipratropium-albuterol (DUONEB) 0.5-2.5 (3) MG/3ML nebulizer solution 3 mL (has no administration in time range)  ?albuterol (PROVENTIL) (2.5 MG/3ML) 0.083% nebulizer solution 2.5 mg (has no administration in time range)  ?sodium chloride flush (NS) 0.9 % injection 3 mL (has no administration in time range)  ?lactated ringers infusion (has no administration in time range)  ?acetaminophen (TYLENOL) tablet 650 mg (has no administration in time range)  ?  Or  ?acetaminophen (TYLENOL) suppository 650 mg (has no administration in time range)  ?docusate sodium (COLACE) capsule 100 mg (has no administration in time range)  ?polyethylene glycol (MIRALAX / GLYCOLAX) packet 17 g (has no administration in time range)  ?bisacodyl (DULCOLAX) EC tablet 5 mg (has no administration in time range)  ?ondansetron (ZOFRAN) tablet 4 mg (has no administration in time range)  ?  Or  ?ondansetron (ZOFRAN) injection 4 mg (has no administration in time range)  ?guaiFENesin (MUCINEX) 12 hr tablet 600 mg (has no administration in time range)  ?nicotine (NICODERM CQ - dosed in mg/24 hours) patch 14 mg (has no administration in time range)  ?heparin injection 5,000 Units (has no administration in time range)  ?pantoprazole (PROTONIX) injection 40 mg (has no administration in time range)   ?ipratropium-albuterol (DUONEB) 0.5-2.5 (3) MG/3ML nebulizer solution 3 mL (3 mLs Nebulization Given 01/24/22 1830)  ?acetaminophen (TYLENOL) tablet 1,000 mg (1,000 mg Oral Given 01/24/22 1922)  ?potassium chloride SA (KLOR-CON M) CR tablet 20 mEq (20 mEq Oral Given 01/24/22 1952)  ?albuterol (PROVENTIL) (2.5 MG/3ML) 0.083% nebulizer solution 2.5 mg (2.5 mg Nebulization Given 01/24/22 1951)  ? ? ? ?IMPRESSION / MDM / ASSESSMENT AND PLAN / ED COURSE  ?I reviewed the triage vital signs and the nursing notes. ?             ?               ? ?51 y.o. male with past medical history of blindness who presents to the ED complaining of productive cough for the past 3 to 4 days with increasing difficulty breathing along with tightness and pressure in his chest. ? ?Differential diagnosis includes, but  is not limited to, COPD, CHF, ACS, PE, pneumonia, bronchitis, viral syndrome. ? ?Patient nontoxic-appearing and in no acute distress, does have slightly increased work of breathing with mild tachypnea but is maintaining O2 sats on room air currently.  He has expiratory wheezing throughout on exam, suspect he has developed COPD given his long history of cigarette smoking.  EKG shows no evidence of arrhythmia or ischemia and I have low suspicion for ACS or PE.  We will screen chest x-ray for pneumonia along with labs including troponin.  We will give additional DuoNeb and reassess. ? ?Chest x-ray is unremarkable, no findings to suggest pneumonia.  Troponin within normal limits and I doubt ACS or PE.  Patient is feeling slightly better following additional breathing treatment, however he dropped O2 sats to 87% on room air, now improved on 2 L nasal cannula.  We will give additional albuterol for ongoing wheezing and case discussed with hospitalist for admission for further management of apparent COPD exacerbation. ? ?  ? ? ?FINAL CLINICAL IMPRESSION(S) / ED DIAGNOSES  ? ?Final diagnoses:  ?COPD exacerbation ([place])  ?Chest pain,  unspecified type  ? ? ? ?Rx / DC Orders  ? ?ED Discharge Orders   ? ? None  ? ?  ? ? ? ?Note:  This document was prepared using Dragon voice recognition software and may include unintentional dictation errors. ?  ?Charna Archer, Ch

## 2022-01-24 NOTE — ED Notes (Signed)
Received report from Efrain RN. ?

## 2022-01-25 ENCOUNTER — Observation Stay
Admit: 2022-01-25 | Discharge: 2022-01-25 | Disposition: A | Discharge status: Home / Self Care | Payer: Medicaid Other | Attending: Internal Medicine | Admitting: Internal Medicine

## 2022-01-25 ENCOUNTER — Observation Stay: Payer: Medicaid Other

## 2022-01-25 DIAGNOSIS — H543 Unqualified visual loss, both eyes: Secondary | ICD-10-CM

## 2022-01-25 DIAGNOSIS — R7309 Other abnormal glucose: Secondary | ICD-10-CM | POA: Diagnosis not present

## 2022-01-25 DIAGNOSIS — R079 Chest pain, unspecified: Secondary | ICD-10-CM | POA: Diagnosis not present

## 2022-01-25 DIAGNOSIS — Z720 Tobacco use: Secondary | ICD-10-CM | POA: Diagnosis present

## 2022-01-25 DIAGNOSIS — E876 Hypokalemia: Secondary | ICD-10-CM | POA: Diagnosis not present

## 2022-01-25 DIAGNOSIS — H547 Unspecified visual loss: Secondary | ICD-10-CM

## 2022-01-25 LAB — URINE DRUG SCREEN, QUALITATIVE (ARMC ONLY)
Amphetamines, Ur Screen: NOT DETECTED
Barbiturates, Ur Screen: NOT DETECTED
Benzodiazepine, Ur Scrn: NOT DETECTED
Cannabinoid 50 Ng, Ur ~~LOC~~: POSITIVE — AB
Cocaine Metabolite,Ur ~~LOC~~: NOT DETECTED
MDMA (Ecstasy)Ur Screen: NOT DETECTED
Methadone Scn, Ur: NOT DETECTED
Opiate, Ur Screen: NOT DETECTED
Phencyclidine (PCP) Ur S: NOT DETECTED
Tricyclic, Ur Screen: NOT DETECTED

## 2022-01-25 LAB — BLOOD GAS, VENOUS
Acid-base deficit: 0.9 mmol/L (ref 0.0–2.0)
Bicarbonate: 22.4 mmol/L (ref 20.0–28.0)
O2 Saturation: 95.2 %
Patient temperature: 37
pCO2, Ven: 33 mmHg — ABNORMAL LOW (ref 44–60)
pH, Ven: 7.44 — ABNORMAL HIGH (ref 7.25–7.43)
pO2, Ven: 62 mmHg — ABNORMAL HIGH (ref 32–45)

## 2022-01-25 LAB — LIPID PANEL
Cholesterol: 145 mg/dL (ref 0–200)
HDL: 37 mg/dL — ABNORMAL LOW (ref >40)
LDL Cholesterol: 91 mg/dL (ref 0–99)
Total CHOL/HDL Ratio: 3.9 RATIO
Triglycerides: 85 mg/dL (ref <150)
VLDL: 17 mg/dL (ref 0–40)

## 2022-01-25 LAB — CBC
HCT: 45.5 % (ref 39.0–52.0)
Hemoglobin: 15.4 g/dL (ref 13.0–17.0)
MCH: 32.4 pg (ref 26.0–34.0)
MCHC: 33.8 g/dL (ref 30.0–36.0)
MCV: 95.8 fL (ref 80.0–100.0)
Platelets: 204 10*3/uL (ref 150–400)
RBC: 4.75 MIL/uL (ref 4.22–5.81)
RDW: 12.1 % (ref 11.5–15.5)
WBC: 3.8 10*3/uL — ABNORMAL LOW (ref 4.0–10.5)
nRBC: 0 % (ref 0.0–0.2)

## 2022-01-25 LAB — ECHOCARDIOGRAM COMPLETE
AR max vel: 3.2 cm2
AV Area VTI: 3.88 cm2
AV Area mean vel: 3.09 cm2
AV Mean grad: 3 mmHg
AV Peak grad: 4.8 mmHg
Ao pk vel: 1.09 m/s
Area-P 1/2: 4.99 cm2
MV VTI: 2.88 cm2
S' Lateral: 3.74 cm

## 2022-01-25 LAB — CREATININE, SERUM
Creatinine, Ser: 0.98 mg/dL (ref 0.61–1.24)
GFR, Estimated: >60 mL/min (ref >60)

## 2022-01-25 LAB — ETHANOL: Alcohol, Ethyl (B): <10 mg/dL (ref <10)

## 2022-01-25 LAB — D-DIMER, QUANTITATIVE: D-Dimer, Quant: 0.51 ug/mL-FEU — ABNORMAL HIGH (ref 0.00–0.50)

## 2022-01-25 MED ORDER — NITROGLYCERIN 0.4 MG SL SUBL: 0.4000 mg | SUBLINGUAL_TABLET | SUBLINGUAL | Status: DC | PRN

## 2022-01-25 MED ORDER — IOHEXOL 350 MG/ML SOLN
75.0000 mL | Freq: Once | INTRAVENOUS | Status: AC | PRN
  Administered 2022-01-25: 75 mL via INTRAVENOUS

## 2022-01-25 MED ORDER — IBUPROFEN 600 MG PO TABS
600.0000 mg | ORAL_TABLET | Freq: Once | ORAL | Status: AC
  Administered 2022-01-25: 600 mg via ORAL
  Filled 2022-01-25: qty 1

## 2022-01-25 MED ORDER — PREDNISONE 20 MG PO TABS: 40.0000 mg | ORAL_TABLET | Freq: Every day | ORAL | 0 refills | Status: DC

## 2022-01-25 MED ORDER — BUDESONIDE-FORMOTEROL FUMARATE 160-4.5 MCG/ACT IN AERO: 2.0000 | INHALATION_SPRAY | Freq: Two times a day (BID) | RESPIRATORY_TRACT | 0 refills | Status: AC

## 2022-01-25 NOTE — ED Notes (Signed)
PT and hospitalist at bedside. ?

## 2022-01-25 NOTE — TOC CM/SW Note (Addendum)
CSW acknowledges consult for home health/DME needs. PT evaluated and determined no needs.  ? ?Dayton Scrape, [place] ?[phone] ? ?3:02 pm: Patient has orders to discharge home today. Chart reviewed. PCP is Princella Ion. On room air. No wounds. No TOC need identified. RN aware he may need a cab voucher to get home if he does not have a ride. No further concerns. CSW signing off. ? ?Dayton Scrape, [place] ?[phone] ? ?

## 2022-01-25 NOTE — Assessment & Plan Note (Signed)
Mildly elevated glucose we will follow. ? ?

## 2022-01-25 NOTE — Assessment & Plan Note (Signed)
Replace and follow levels. ?No EKG changes of hypokalemia. ?

## 2022-01-25 NOTE — Assessment & Plan Note (Addendum)
EKG today sinus rhythm with no ST-T wave changes. ? ?We will continue patient on as needed nitroglycerin. ?But we will also continue with DuoNeb therapy and Solu-Medrol therapy.  Supplemental oxygen as needed. ?CT angio chest pending.  Patient had nodules on chest CT done in 2018 and D-dimer is elevated today so we will do a follow-up CT patient also has mild clubbing.  Question hiatal hernia question esophagitis. ?Patient started on IV PPI therapy.  We will also obtain a 2D echocardiogram.  Troponins are negative.  Will defer to a.m. team to schedule patient for stress test. ?

## 2022-01-25 NOTE — Assessment & Plan Note (Signed)
Nicotine patch. ?Nursing counseling about tobacco cessation and education . ? ?

## 2022-01-25 NOTE — ED Notes (Signed)
Pt A&O, IV removed, pt given discharge instructions, pt assisted to from lobby. ?

## 2022-01-25 NOTE — Evaluation (Signed)
Physical Therapy Evaluation ?Patient Details ?Name: Jeremiah Bright ?MRN: [id number] ?DOB: [birth year] ?Today's Date: 01/25/2022 ? ?History of Present Illness ? Patient is a 51 year old male who reported to [facility] ED on 01/24/22 due to complaints of SOB and chest pain. Patient has a PMH (+) for blindness and tobacco use. ?  ?Clinical Impression ? Physical Therapy Evaluation completed on this date. Patient tolerated session well and was agreeable to treatment. Only mild headache reported. Patient states he was Mod I with all ADLs and mobility at baseline, and ambulates with a walking stick. At baseline patient is totally blind, and lives in an entry level apartment by himself. Patient demonstrated strength and ROM WFL, and completed all bed mobility, and transfers at baseline level of function (Mod I). Patient required CGA/HHA during ambulation for safety to guide patient throughout the ED (as patient did not have his walking stick present). Patient is demonstrating at/near baseline level of function, and does not need continued skilled physical therapy at this time. Signing off.  ?   ? ?Recommendations for follow up therapy are one component of a multi-disciplinary discharge planning process, led by the attending physician.  Recommendations may be updated based on patient status, additional functional criteria and insurance authorization. ? ?Follow Up Recommendations No PT follow up ? ?  ?Assistance Recommended at Discharge None  ?Patient can return home with the following ?   ? ?  ?Equipment Recommendations None recommended by PT  ?Recommendations for Other Services ?    ?  ?Functional Status Assessment Patient has not had a recent decline in their functional status  ? ?  ?Precautions / Restrictions Precautions ?Precautions: Fall ?Restrictions ?Weight Bearing Restrictions: No  ? ?  ? ?Mobility ? Bed Mobility ?Overal bed mobility: Modified Independent ?  ?  ?  ?  ?  ?  ?  ?  ? ?Transfers ?Overall transfer level: Modified  independent ?  ?  ?  ?  ?  ?  ?  ?  ?  ?  ? ?Ambulation/Gait ?Ambulation/Gait assistance: Min guard ?Gait Distance (Feet): 200 Feet ?Assistive device: 1 person hand held assist (as a guide as patient did not have walking stick present) ?Gait Pattern/deviations: WFL(Within Functional Limits) ?Gait velocity: Normal gait speed ?  ?  ?  ? ?Stairs ?  ?  ?  ?  ?  ? ?Wheelchair Mobility ?  ? ?Modified Rankin (Stroke Patients Only) ?  ? ?  ? ?Balance Overall balance assessment: Modified Independent ?  ?  ?  ?  ?  ?  ?  ?  ?  ?  ?  ?  ?  ?  ?  ?  ?  ?  ?   ? ? ? ?Pertinent Vitals/Pain Pain Assessment ?Pain Assessment: 0-10 ?Pain Location: headache ?Pain Descriptors / Indicators: Discomfort ?Pain Intervention(s): Monitored during session  ? ? ?Home Living Family/patient expects to be discharged to:: Private residence ?Living Arrangements: Alone ?  ?Type of Home: Apartment ?Home Access: Level entry ?  ?  ?  ?Home Layout: One level ?  ?Additional Comments: Walking stick  ?  ?Prior Function Prior Level of Function : Independent/Modified Independent ?  ?  ?  ?  ?  ?  ?Mobility Comments: Patient has a driver, patient is totally blind ?  ?  ? ? ?Hand Dominance  ? Dominant Hand: Right ? ?  ?Extremity/Trunk Assessment  ? Upper Extremity Assessment ?Upper Extremity Assessment: Overall WFL for tasks assessed ?  ? ?  Lower Extremity Assessment ?Lower Extremity Assessment: Overall WFL for tasks assessed ?  ? ?Cervical / Trunk Assessment ?Cervical / Trunk Assessment: Normal  ?Communication  ? Communication: No difficulties (Blind)  ?Cognition Arousal/Alertness: Awake/alert ?Behavior During Therapy: [facility] for tasks assessed/performed ?  ?  ?  ?  ?  ?  ?  ?  ?  ?  ?  ?  ?  ?  ?  ?  ?  ?General Comments: A&Ox 3 self location and situation ?  ?  ? ?  ?General Comments General comments (skin integrity, edema, etc.): Vitals WFL ? ?  ?Exercises Other Exercises ?Other Exercises: patient educated on role of PT in acute care setting, fall risk, and d/c  recommendations  ? ?Assessment/Plan  ?  ?PT Assessment Patient does not need any further PT services  ?PT Problem List   ? ?   ?  ?PT Treatment Interventions     ? ?PT Goals (Current goals can be found in the Care Plan section)  ?Acute Rehab PT Goals ?Patient Stated Goal: none stated ?PT Goal Formulation: With patient ?Time For Goal Achievement: 02/08/22 ?Potential to Achieve Goals: Good ? ?  ?Frequency   ?  ? ? ?Co-evaluation   ?  ?  ?  ?  ? ? ?  ?AM-PAC PT "6 Clicks" Mobility  ?Outcome Measure Help needed turning from your back to your side while in a flat bed without using bedrails?: None ?Help needed moving from lying on your back to sitting on the side of a flat bed without using bedrails?: None ?Help needed moving to and from a bed to a chair (including a wheelchair)?: None ?Help needed standing up from a chair using your arms (e.g., wheelchair or bedside chair)?: None ?Help needed to walk in hospital room?: None ?Help needed climbing 3-5 steps with a railing? : None ?6 Click Score: 24 ? ?  ?End of Session Equipment Utilized During Treatment: Gait belt ?Activity Tolerance: Patient tolerated treatment well ?Patient left: in bed;with call bell/phone within reach ?Nurse Communication: Mobility status ?  ?  ? ?Time: 0973-5329 ?PT Time Calculation (min) (ACUTE ONLY): 20 min ? ? ?Charges:   PT Evaluation ?$PT Eval Low Complexity: 1 Low ?PT Treatments ?$Gait Training: 8-22 mins ?  ?   ? ? ?Angelica Ran, PT  ?01/25/22. 10:07 AM ? ? ?

## 2022-01-25 NOTE — Discharge Summary (Signed)
Physician Discharge Summary  ?Jeremiah Bright UUV:[id number] DOB: 09-Jun-1971 DOA: 01/24/2022 ? ?[facility]: [facility] ? ?Admit date: 01/24/2022 ?Discharge date: 01/25/2022 ? ?Admitted From: home ?Disposition:  home ? ?Recommendations for Outpatient Follow-up:  ?Follow up with PCP in 1-2 weeks ?Please follow up on the following pending results: echocardiogram results ?Patient will need follow-up CT in 1 year for pulmonary nodules ? ?Home Health: none  ?Equipment/Devices: none ? ?Discharge Condition: good  ?CODE STATUS: FULL  ?Diet recommendation:  ?Diet Orders (From admission, onward)  ? ?  Start     Ordered  ? 01/25/22 4742  Diet Heart Room service appropriate? Yes; Fluid consistency: Thin  Diet effective now       ?Question Answer Comment  ?Room service appropriate? Yes   ?Fluid consistency: Thin   ?  ? 01/25/22 0921  ? ?  ?  ? ?  ? ? ? ?Brief/Interim Summary: ?Patient presented to the emergency department 01/24/2022 chief complaint of increasing chest pain, increasing shortness of breath.  Ruled out for acute coronary syndrome with known concerning EKGs, flat troponins.  Treated for COPD exacerbation with improvement in symptoms.  CT chest negative for PE was positive for pulmonary nodules which will need follow-up outpatient.  Evaluated by PT/OT no home needs identified.  2D echocardiogram was performed but results not available at time of discharge, discussed with patient that given normal cardiac work-up thus far and resolution of symptoms, risk of significant abnormality on echocardiogram is low but not 0, in shared decision making he prefers to discharge from the hospital and follow-up outpatient, agrees to seek emergency medical care if symptoms recur or other concerns. ? ?Consultants:  ?none ? ?Procedures:  ?none ? ? ? ? ? ?Discharge Diagnoses: ?Principal Problem: ?  Chest pain ?Active Problems: ?  Tobacco abuse ?  Blindness ?  Hypokalemia ?  Elevated glucose ? ? ?Chest pain: Acute coronary  syndrome ruled out.  Echocardiogram pending but patient felt safe/stable to leave to follow-up on this result outpatient ? ?COPD/tobacco abuse: Patient discharged on inhalers and steroid taper, follow-up outpatient ? ?Hypokalemia: Mild, repleted, advised follow-up with PCP to recheck BMP within 1 week ? ? ? ?Discharge Instructions ? ? ?Allergies as of 01/25/2022   ?No Known Allergies ?  ? ?  ?Medication List  ?  ? ?TAKE these medications   ? ?budesonide-formoterol 160-4.5 MCG/ACT inhaler ?Commonly known as: SYMBICORT ?Inhale 2 puffs into the lungs 2 (two) times daily. ?  ?ibuprofen 600 MG tablet ?Commonly known as: ADVIL ?Take 1 tablet (600 mg total) by mouth every 6 (six) hours as needed. ?  ?oxyCODONE-acetaminophen 5-325 MG tablet ?Commonly known as: Roxicet ?Take 1 tablet by mouth every 6 (six) hours as needed for severe pain. ?  ?predniSONE 20 MG tablet ?Commonly known as: DELTASONE ?Take 2 tablets (40 mg total) by mouth daily with breakfast. ?Start taking on: Jan 26, 2022 ?  ? ?  ? ?Diet Orders (From admission, onward)  ? ?  Start     Ordered  ? 01/25/22 5956  Diet Heart Room service appropriate? Yes; Fluid consistency: Thin  Diet effective now       ?Question Answer Comment  ?Room service appropriate? Yes   ?Fluid consistency: Thin   ?  ? 01/25/22 0921  ? ?  ?  ? ?  ? ? ? ? ?No Known Allergies ? ? ?Subjective: Patient seen and examined resting comfortably in bed in emergency department, no apparent distress.  He states  his chest pain has resolved, he has a bit of a headache that has responded well to ibuprofen, requesting for discharge ? ? ?Discharge Exam: ?Vitals:  ? 01/25/22 0800 01/25/22 0900  ?BP: 101/79 108/69  ?Pulse: 100 73  ?Resp: 15 13  ?Temp:    ?SpO2: 90% 92%  ? ?Vitals:  ? 01/25/22 0400 01/25/22 0600 01/25/22 0800 01/25/22 0900  ?BP: (!) 135/94 111/73 101/79 108/69  ?Pulse: 89 82 100 73  ?Resp: 13 17 15 13   ?Temp:      ?TempSrc:      ?SpO2: 97% 93% 90% 92%  ? ? ? ?General: Pt is alert, awake, not in  acute distress ?Cardiovascular: RRR, S1/S2, no rubs, no gallops ?Respiratory: CTA bilaterally, no wheezing, no rhonchi ?Abdominal: Soft, NT, ND, bowel sounds + ?Extremities: no edema, no cyanosis ? ? ? ? ?The results of significant diagnostics from this hospitalization (including imaging, microbiology, ancillary and laboratory) are listed below for reference.   ? ? ?Microbiology: ?Recent Results (from the past 240 hour(s))  ?Resp Panel by RT-PCR (Flu A&B, Covid) Nasopharyngeal Swab     Status: None  ? Collection Time: 01/24/22  6:25 PM  ? Specimen: Nasopharyngeal Swab; Nasopharyngeal(NP) swabs in vial transport medium  ?Result Value Ref Range Status  ? SARS Coronavirus 2 by RT PCR NEGATIVE NEGATIVE Final  ?  Comment: (NOTE) ?SARS-CoV-2 target nucleic acids are NOT DETECTED. ? ?The SARS-CoV-2 RNA is generally detectable in upper respiratory ?specimens during the acute phase of infection. The lowest ?concentration of SARS-CoV-2 viral copies this assay can detect is ?138 copies/mL. A negative result does not preclude SARS-Cov-2 ?infection and should not be used as the sole basis for treatment or ?other patient management decisions. A negative result may occur with  ?improper specimen collection/handling, submission of specimen other ?than nasopharyngeal swab, presence of viral mutation(s) within the ?areas targeted by this assay, and inadequate number of viral ?copies(<138 copies/mL). A negative result must be combined with ?clinical observations, patient history, and epidemiological ?information. The expected result is Negative. ? ?Fact Sheet for Patients:  ?BloggerCourse.comhttps://www.fda.gov/media/152166/download ? ?Fact Sheet for Healthcare Providers:  ?SeriousBroker.ithttps://www.fda.gov/media/152162/download ? ?This test is no t yet approved or cleared by the [place] FDA and  ?has been authorized for detection and/or diagnosis of SARS-CoV-2 by ?FDA under an Emergency Use Authorization (EUA). This EUA will remain  ?in effect (meaning this  test can be used) for the duration of the ?COVID-19 declaration under Section 564(b)(1) of the Act, 21 ?U.S.C.section 360bbb-3(b)(1), unless the authorization is terminated  ?or revoked sooner.  ? ? ?  ? Influenza A by PCR NEGATIVE NEGATIVE Final  ? Influenza B by PCR NEGATIVE NEGATIVE Final  ?  Comment: (NOTE) ?The Xpert Xpress SARS-CoV-2/FLU/RSV plus assay is intended as an aid ?in the diagnosis of influenza from Nasopharyngeal swab specimens and ?should not be used as a sole basis for treatment. Nasal washings and ?aspirates are unacceptable for Xpert Xpress SARS-CoV-2/FLU/RSV ?testing. ? ?Fact Sheet for Patients: ?BloggerCourse.comhttps://www.fda.gov/media/152166/download ? ?Fact Sheet for Healthcare Providers: ?SeriousBroker.ithttps://www.fda.gov/media/152162/download ? ?This test is not yet approved or cleared by the [place] FDA and ?has been authorized for detection and/or diagnosis of SARS-CoV-2 by ?FDA under an Emergency Use Authorization (EUA). This EUA will remain ?in effect (meaning this test can be used) for the duration of the ?COVID-19 declaration under Section 564(b)(1) of the Act, 21 U.S.C. ?section 360bbb-3(b)(1), unless the authorization is terminated or ?revoked. ? ?Performed at [facility], [address] [place], ?[place]  [address] ?  ?  ? ?Labs: ?BNP (last 3 results) ?No results for input(s): BNP in the last 8760 hours. ?Basic Metabolic Panel: ?Recent Labs  ?Lab 01/24/22 ?1825 01/24/22 ?2016  ?NA 138  --   ?K 3.3*  --   ?CL 110  --   ?CO2 22  --   ?GLUCOSE 128*  --   ?BUN 10  --   ?CREATININE 1.04 0.98  ?CALCIUM 8.4*  --   ? ?Liver Function Tests: ?No results for input(s): AST, ALT, ALKPHOS, BILITOT, PROT, ALBUMIN in the last 168 hours. ?No results for input(s): LIPASE, AMYLASE in the last 168 hours. ?No results for input(s): AMMONIA in the last 168 hours. ?CBC: ?Recent Labs  ?Lab 01/24/22 ?1825 01/24/22 ?2016  ?WBC 6.6 3.8*  ?NEUTROABS 5.0  --   ?HGB 15.7 15.4  ?HCT 45.1 45.5  ?MCV 95.8 95.8  ?PLT 202 204   ? ?Cardiac Enzymes: ?No results for input(s): CKTOTAL, CKMB, CKMBINDEX, TROPONINI in the last 168 hours. ?BNP: ?Invalid input(s): POCBNP ?CBG: ?No results for input(s): GLUCAP in the last 168 hours. ?

## 2022-01-25 NOTE — Progress Notes (Signed)
OT Cancellation Note ? ?Patient Details ?Name: Jeremiah Bright ?MRN: [id number] ?DOB: Jul 01, 1971 ? ? ?Cancelled Treatment:    Reason Eval/Treat Not Completed: OT screened, no needs identified, will sign off. Order received, chart reviewed. Pt back to baseline functional independence. No skilled OT needs identified. Will sign off. Please re-consult if additional needs arise.  ? ?Kathie Dike, M.S. OTR/L  ?01/25/22, 10:33 AM  ?ascom [phone] ? ?

## 2022-01-25 NOTE — Assessment & Plan Note (Signed)
Fall precautions. ?Up with ambulation. ?Assistance to and from the bed to the bathroom. ?

## 2022-05-10 ENCOUNTER — Encounter: Payer: Self-pay | Admitting: Emergency Medicine

## 2022-05-10 DIAGNOSIS — L03115 Cellulitis of right lower limb: Secondary | ICD-10-CM | POA: Diagnosis not present

## 2022-05-10 DIAGNOSIS — S70361A Insect bite (nonvenomous), right thigh, initial encounter: Secondary | ICD-10-CM | POA: Insufficient documentation

## 2022-05-10 DIAGNOSIS — W57XXXA Bitten or stung by nonvenomous insect and other nonvenomous arthropods, initial encounter: Secondary | ICD-10-CM | POA: Diagnosis not present

## 2022-05-10 NOTE — ED Triage Notes (Signed)
Pt presents via POV with complaints of insect bites on his forearm and right posterior calf. He stated that the bites occurred last night - no erythema noted. Denies fevers, chills, CP or SOB.

## 2022-05-11 ENCOUNTER — Emergency Department
Admission: EM | Admit: 2022-05-11 | Discharge: 2022-05-11 | Disposition: A | Discharge status: Home / Self Care | Payer: Medicaid Other | Attending: Emergency Medicine | Admitting: Emergency Medicine

## 2022-05-11 DIAGNOSIS — W57XXXA Bitten or stung by nonvenomous insect and other nonvenomous arthropods, initial encounter: Secondary | ICD-10-CM

## 2022-05-11 DIAGNOSIS — L03115 Cellulitis of right lower limb: Secondary | ICD-10-CM

## 2022-05-11 MED ORDER — CEPHALEXIN 500 MG PO CAPS: 500.0000 mg | ORAL_CAPSULE | Freq: Three times a day (TID) | ORAL | 0 refills | Status: AC

## 2022-05-11 MED ORDER — KETOROLAC TROMETHAMINE 30 MG/ML IJ SOLN
30.0000 mg | Freq: Once | INTRAMUSCULAR | Status: AC
Start: 2022-05-11 — End: 2022-05-11
  Administered 2022-05-11: 30 mg via INTRAMUSCULAR
  Filled 2022-05-11: qty 1

## 2022-05-11 MED ORDER — CEPHALEXIN 500 MG PO CAPS
500.0000 mg | ORAL_CAPSULE | Freq: Once | ORAL | Status: AC
  Administered 2022-05-11: 500 mg via ORAL
  Filled 2022-05-11: qty 1

## 2022-05-11 NOTE — Discharge Instructions (Signed)
Please take Tylenol and ibuprofen/Advil for your pain.  It is safe to take them together, or to alternate them every few hours.  Take up to 1000mg of Tylenol at a time, up to 4 times per day.  Do not take more than 4000 mg of Tylenol in 24 hours.  For ibuprofen, take 400-600 mg, 3 - 4 times per day.  

## 2022-05-11 NOTE — ED Provider Notes (Signed)
[facility] Provider Note    Event Date/Time   First MD Initiated Contact with Patient 05/11/22 0037     (approximate)   History   Insect Bite   HPI  Jeremiah Bright is a 51 y.o. 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    He presents to the ED for the ration of multiple bug bites to his right-sided leg as well as 1 to his right forearm.  He reports he was told that it was a spider that bit him and had severe pain.  This was 2 days ago.  He reports the pain is gotten better but he presents to the ED for evaluation to make sure everything is okay because he cannot see the wounds.  No fevers or systemic symptoms.   Physical Exam   Triage Vital Signs: ED Triage Vitals  Enc Vitals Group     BP 05/10/22 1944 (!) 133/91     Pulse Rate 05/10/22 1944 86     Resp 05/10/22 1944 16     Temp 05/10/22 1944 98.7 F (37.1 C)     Temp Source 05/10/22 1944 Oral     SpO2 05/10/22 1944 91 %     Weight 05/10/22 1951 236 lb (107 kg)     Height 05/10/22 1951 6\' 3"  (1.905 m)     Head Circumference --      Peak Flow --      Pain Score 05/10/22 1950 3     Pain Loc --      Pain Edu? --      Excl. in GC? --     Most recent vital signs: Vitals:   05/10/22 1944  BP: (!) 133/91  Pulse: 86  Resp: 16  Temp: 98.7 F (37.1 C)  SpO2: 91%    General: Awake, no distress.  CV:  Good peripheral perfusion.  Resp:  Normal effort.  Abd:  No distention.  MSK:  No deformity noted.  Neuro:  No focal deficits appreciated. Other:  2 pustules to the right medial calf without surrounding erythema. 1 pustule to the right medial thigh with minimal surrounding induration without erythema. 1 pustule to the ulnar aspect of the proximal right forearm without erythema or induration.   ED Results / Procedures / Treatments   Labs (all labs ordered are listed, but only abnormal results are  displayed) Labs Reviewed - No data to display  EKG   RADIOLOGY   Official radiology report(s): No results found.  PROCEDURES and INTERVENTIONS:  Procedures  Medications  cephALEXin (KEFLEX) capsule 500 mg (500 mg Oral Given 05/11/22 0100)  ketorolac (TORADOL) 30 MG/ML injection 30 mg (30 mg Intramuscular Given 05/11/22 0100)     IMPRESSION / MDM / ASSESSMENT AND PLAN / ED COURSE  I reviewed the triage vital signs and the nursing notes.  Differential diagnosis includes, but is not limited to, cellulitis, abscess, sepsis  {Patient presents with symptoms of an acute illness or injury that is potentially life-threatening.   50 year old male presents to the ED with multiple pustules and insect bites, 1 of which with some surrounding induration concerning for developing cellulitis suitable for outpatient management with addition of antibiotics.  Looks systemically well without signs of systemic illness, sepsis or more severe pathology.  Has a few small pustules but no discrete abscess.  We will start him on antibiotics for 5 days  to treat mild cellulitis.  We discussed return precautions.     FINAL CLINICAL IMPRESSION(S) / ED DIAGNOSES   Final diagnoses:  Insect bite of right thigh, initial encounter  Cellulitis of right lower extremity     Rx / DC Orders   ED Discharge Orders          Ordered    cephALEXin (KEFLEX) 500 MG capsule  3 times daily        05/11/22 0055             Note:  This document was prepared using Dragon voice recognition software and may include unintentional dictation errors.   Delton [place], MD 05/11/22 [phone]

## 2022-05-11 NOTE — ED Notes (Signed)
Pt Dc to home. Dc instructions reviewed with all questions answered. Pt verbalizes understanding. Pt assisted out to lobby via wheelchair by staff to contact friend for transport home

## 2022-11-23 DIAGNOSIS — M25552 Pain in left hip: Secondary | ICD-10-CM | POA: Diagnosis not present

## 2022-11-23 DIAGNOSIS — Z1389 Encounter for screening for other disorder: Secondary | ICD-10-CM | POA: Diagnosis not present

## 2022-11-29 ENCOUNTER — Other Ambulatory Visit: Payer: Self-pay

## 2022-11-29 ENCOUNTER — Ambulatory Visit
Admission: RE | Admit: 2022-11-29 | Discharge: 2022-11-29 | Disposition: A | Discharge status: Home / Self Care | Payer: Medicaid Other | Attending: Primary Care | Admitting: Primary Care

## 2022-11-29 ENCOUNTER — Emergency Department
Admission: EM | Admit: 2022-11-29 | Discharge: 2022-11-29 | Discharge status: Against Medical Advice | Payer: Medicaid Other | Attending: Emergency Medicine | Admitting: Emergency Medicine

## 2022-11-29 ENCOUNTER — Other Ambulatory Visit: Payer: Self-pay | Admitting: Primary Care

## 2022-11-29 ENCOUNTER — Ambulatory Visit
Admission: RE | Admit: 2022-11-29 | Discharge: 2022-11-29 | Disposition: A | Discharge status: Home / Self Care | Payer: Medicaid Other | Source: Ambulatory Visit | Attending: Primary Care | Admitting: Primary Care

## 2022-11-29 DIAGNOSIS — Z5321 Procedure and treatment not carried out due to patient leaving prior to being seen by health care provider: Secondary | ICD-10-CM | POA: Insufficient documentation

## 2022-11-29 DIAGNOSIS — M79602 Pain in left arm: Secondary | ICD-10-CM | POA: Diagnosis not present

## 2022-11-29 DIAGNOSIS — M25552 Pain in left hip: Secondary | ICD-10-CM

## 2022-11-29 NOTE — ED Triage Notes (Signed)
Pt reports L hip pain with radiation down L leg. Pt also reports intermittent L arm pain. Reports hx of same with a flare every couple months. 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 Pt ambulatory to triage. Breathing unlabored speaking in full sentences. Pt visually impaired and reports he hits his L side often around his house. Pt alert and oriented.

## 2022-11-29 NOTE — ED Notes (Signed)
Pt to stat desk stating he has an ortho referral to another facility but had transportation issues, so he decided to come here to be evaluated, but since its so late, he's going to try and get in with the ortho facility he has a referral to when they open.  Pt advised to return if needed.  Pt agreed and ambulated out of ED lobby with family member

## 2022-12-02 ENCOUNTER — Encounter: Payer: Self-pay | Admitting: Family Medicine

## 2023-01-20 IMAGING — CT CT ANGIO CHEST
2 of 7 series · 17 of 46 positions shown · IV contrast (APPLIED)
Comparison: Chest radiographs dated 01/24/2022. CTA chest dated
11/09/2016.

CLINICAL DATA: Chest pain, shortness of breath, evaluate for PE

EXAM:
CT ANGIOGRAPHY CHEST WITH CONTRAST
TECHNIQUE: Multidetector CT imaging of the chest was performed using the
standard protocol during bolus administration of intravenous
contrast. Multiplanar CT image reconstructions and MIPs were
obtained to evaluate the vascular anatomy.

[Series 7: thins · axial · 0.92mm/px · z∈[-500,-206]mm · 14 of 475 slices shown]
[im 27/475  lung]
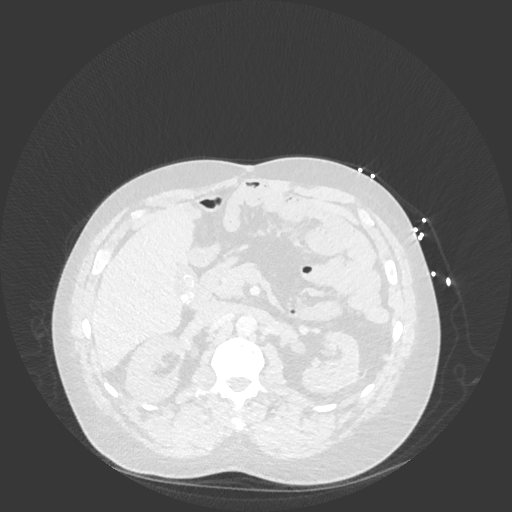
[im 53/475  soft-tissue]
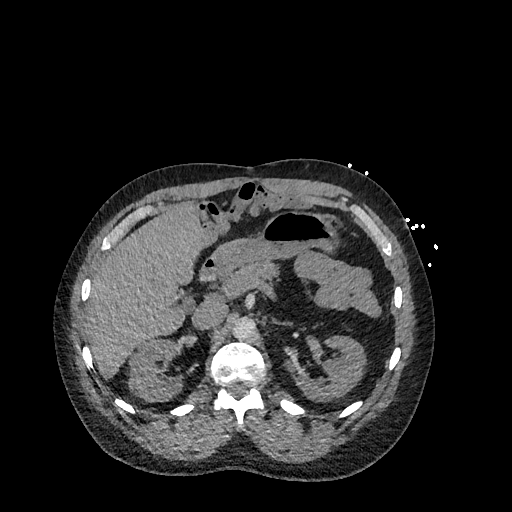
[im 106/475  lung]
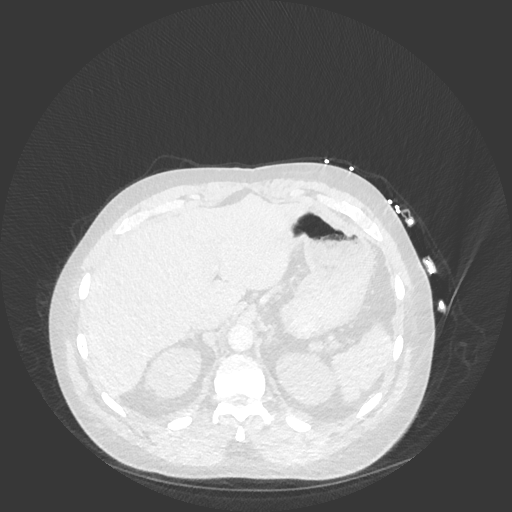
[im 132/475  soft-tissue]
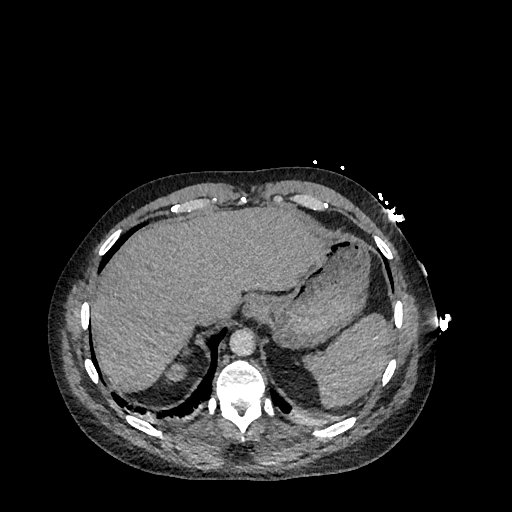
[im 159/475  lung]
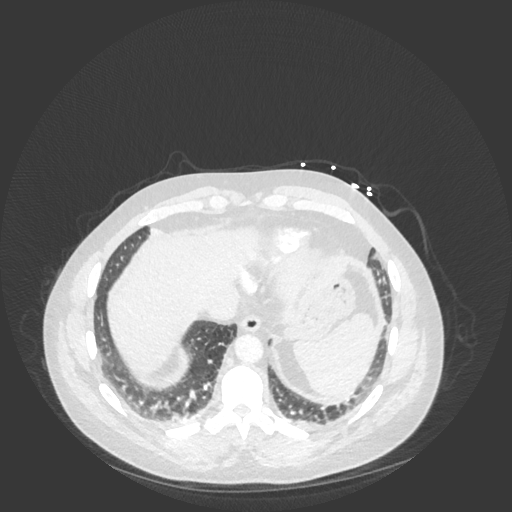
[im 185/475  soft-tissue]
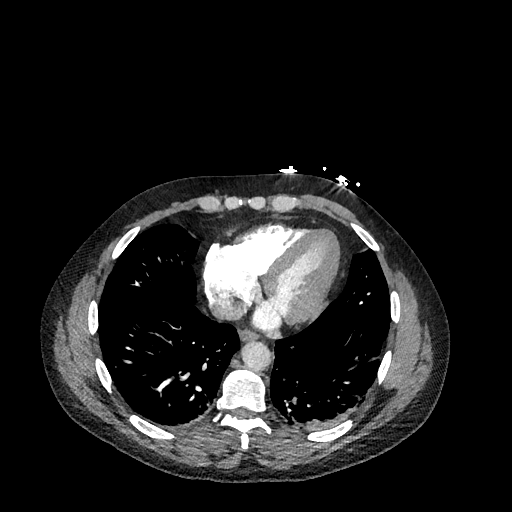
[im 211/475  lung]
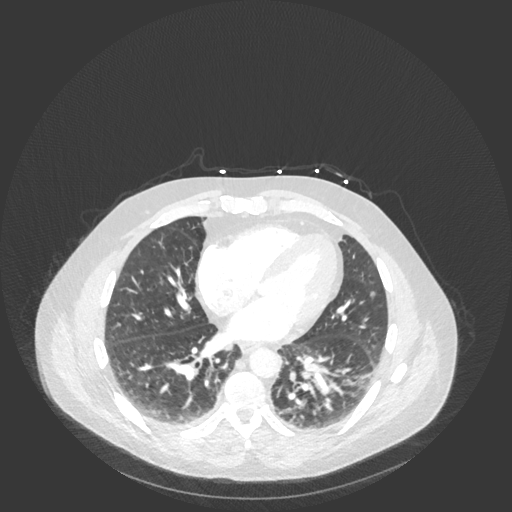
[im 264/475  soft-tissue]
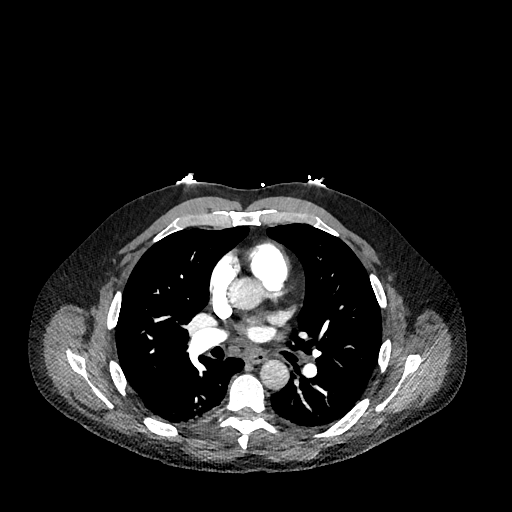
[im 290/475  lung]
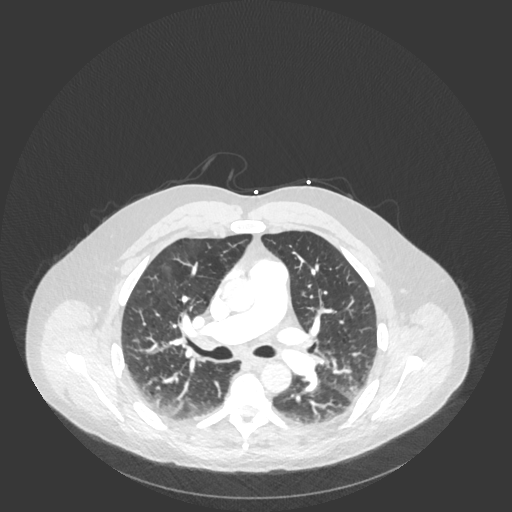
[im 317/475  soft-tissue]
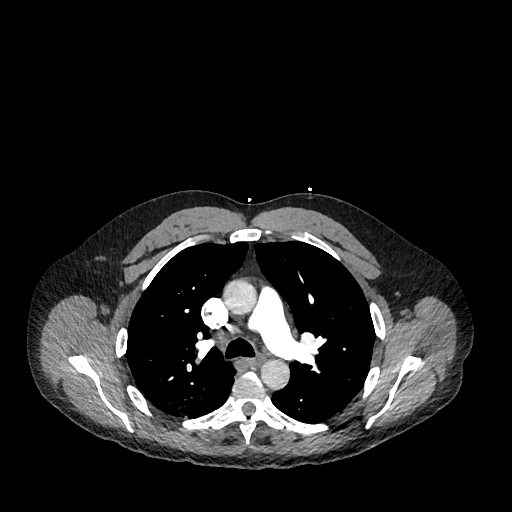
[im 343/475  lung]
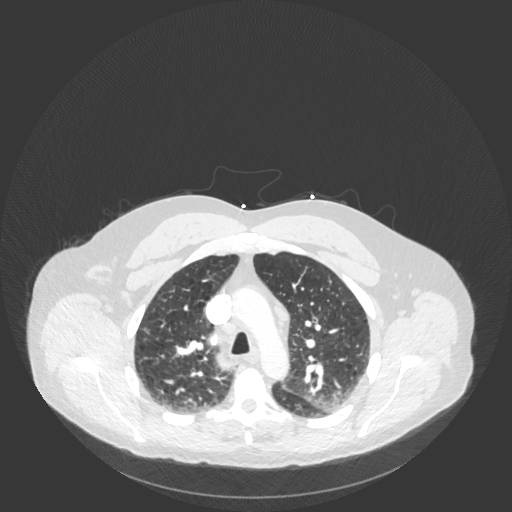
[im 369/475  soft-tissue]
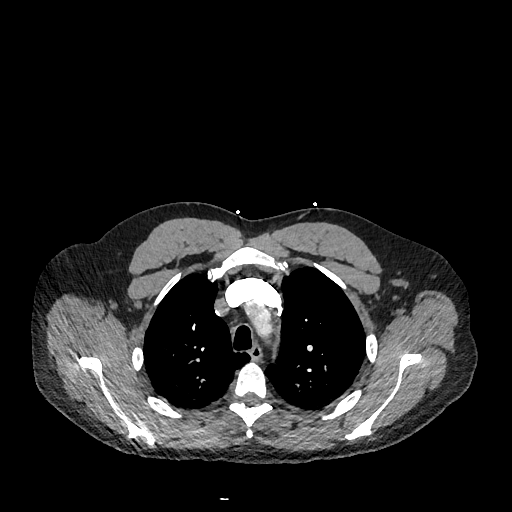
[im 422/475  lung]
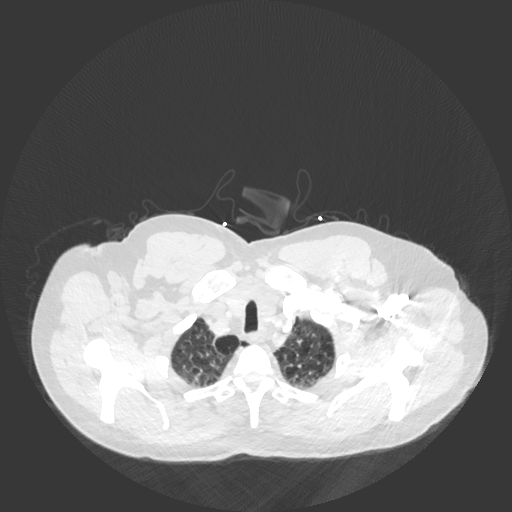
[im 448/475  soft-tissue]
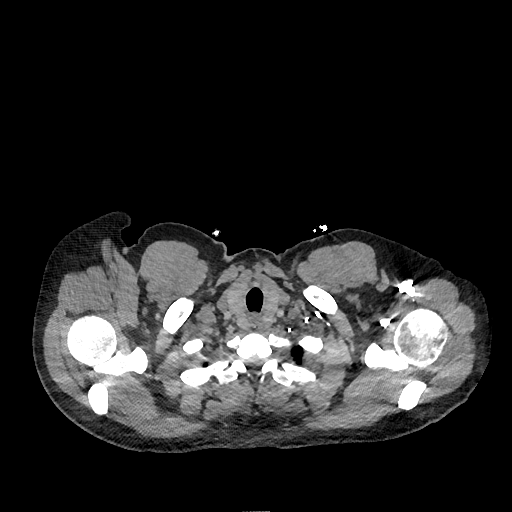

[Series 8: cor · coronal · 0.67mm/px · 3 of 133 slices shown]
[im 34/133  soft-tissue]
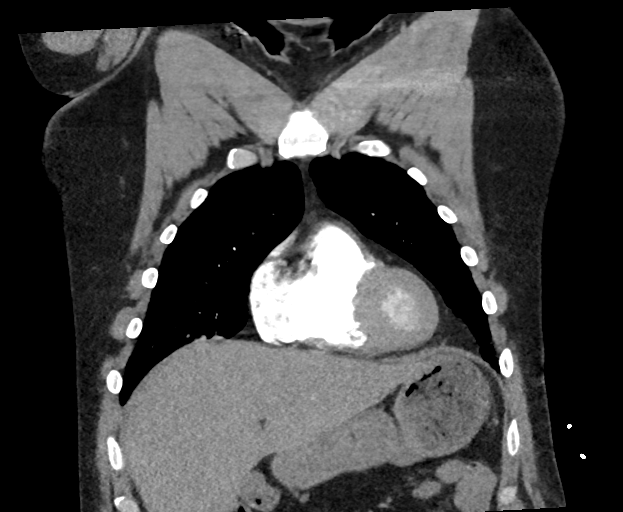
[im 67/133  soft-tissue]
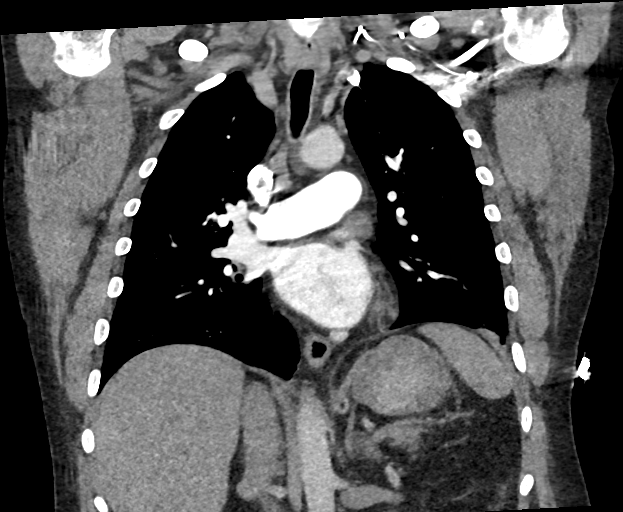
[im 100/133  soft-tissue]
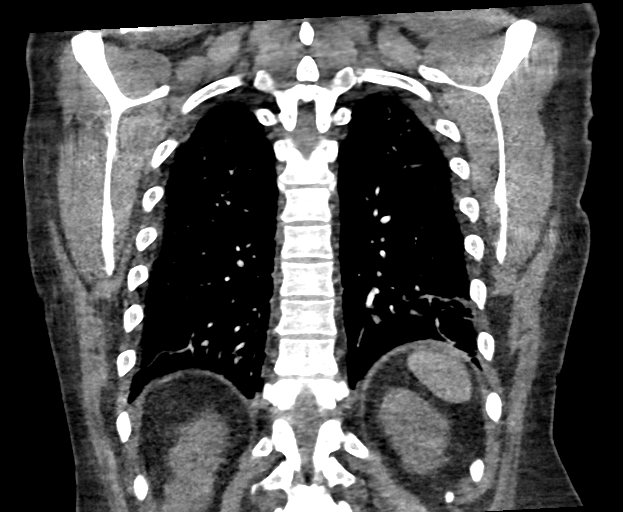

[17 of 46 positions shown; findings below may reference images not displayed]

RADIATION DOSE REDUCTION: This exam was performed according to the
departmental dose-optimization program which includes automated
exposure control, adjustment of the mA and/or kV according to
patient size and/or use of iterative reconstruction technique.

CONTRAST:  75mL OMNIPAQUE IOHEXOL 350 MG/ML SOLN
FINDINGS: Cardiovascular: Satisfactory opacification of the bilateral
pulmonary arteries to the segmental level. No evidence of pulmonary
embolism.

Although not tailored for evaluation of the thoracic aorta, there is
no evidence of thoracic aortic aneurysm or dissection.

The heart is normal in size.  No pericardial effusion.

Mediastinum/Nodes: Small mediastinal lymph nodes which do not meet
pathologic CT size criteria.

Visualized thyroid is unremarkable.

Lungs/Pleura: Mild to moderate centrilobular and paraseptal
emphysematous changes, upper lung predominant.

Mild dependent atelectasis in the bilateral upper and lower lobes.

Superimpose platelike scarring/atelectasis in the left lower lobe
with mild bibasilar atelectasis. No focal consolidation.

7 mm lingular nodule (series 6/image 94), unchanged from 1398,
benign. Additional scattered small bilateral pulmonary nodules
measuring 2-3 mm, not definitely visualized on the prior, noting
differences in technique/slice thickness.

No pleural effusion or pneumothorax.

Upper Abdomen: Visualized upper abdomen is notable for
cholelithiasis, without associated inflammatory changes.

Musculoskeletal: Visualized osseous structures are within normal
limits.

Review of the MIP images confirms the above findings.
IMPRESSION: No evidence of pulmonary embolism.

Scattered atelectasis, lower lobe predominant.

Stable lingular nodule measuring 7 mm, unchanged from 1398, benign.
No follow-up is recommended.

Additional scattered pulmonary nodules measuring up to 3 mm, not
definitely visualized on the prior. Follow-up CT chest is suggested
in 1 year in this high risk patient.

Emphysema ([id number]).

## 2023-02-13 ENCOUNTER — Other Ambulatory Visit: Payer: Self-pay

## 2023-02-13 DIAGNOSIS — R519 Headache, unspecified: Secondary | ICD-10-CM | POA: Insufficient documentation

## 2023-02-13 DIAGNOSIS — G4489 Other headache syndrome: Secondary | ICD-10-CM | POA: Diagnosis not present

## 2023-02-13 MED ORDER — ACETAMINOPHEN 500 MG PO TABS
1000.0000 mg | ORAL_TABLET | Freq: Once | ORAL | Status: AC
  Administered 2023-02-13: 1000 mg via ORAL
  Filled 2023-02-13: qty 2

## 2023-02-13 NOTE — ED Triage Notes (Signed)
Pt to ed from home via [facility] for headache x 2 hours. Episode similar about 2 months ago. Didn't seek treatment. Worse when he talks. Constant sharp pain. Pt has HX of blindness.   142/97 96% 91HR  Pt is caox4, in no acute distress. Pt states "I drank some vinegar to help". Pt took his BP meds too.

## 2023-02-14 ENCOUNTER — Emergency Department: Payer: 59

## 2023-02-14 ENCOUNTER — Emergency Department
Admission: EM | Admit: 2023-02-14 | Discharge: 2023-02-14 | Disposition: A | Discharge status: Home / Self Care | Payer: 59 | Attending: Emergency Medicine | Admitting: Emergency Medicine

## 2023-02-14 DIAGNOSIS — R519 Headache, unspecified: Secondary | ICD-10-CM | POA: Diagnosis not present

## 2023-02-14 MED ORDER — METOCLOPRAMIDE HCL 5 MG/ML IJ SOLN
10.0000 mg | Freq: Once | INTRAMUSCULAR | Status: AC
  Administered 2023-02-14: 10 mg via INTRAVENOUS
  Filled 2023-02-14: qty 2

## 2023-02-14 MED ORDER — AMOXICILLIN-POT CLAVULANATE 875-125 MG PO TABS: 1.0000 | ORAL_TABLET | Freq: Two times a day (BID) | ORAL | 0 refills | Status: DC

## 2023-02-14 MED ORDER — DIPHENHYDRAMINE HCL 50 MG/ML IJ SOLN
50.0000 mg | Freq: Once | INTRAMUSCULAR | Status: AC
  Administered 2023-02-14: 50 mg via INTRAVENOUS
  Filled 2023-02-14: qty 1

## 2023-02-14 MED ORDER — KETOROLAC TROMETHAMINE 30 MG/ML IJ SOLN
30.0000 mg | Freq: Once | INTRAMUSCULAR | Status: AC
Start: 2023-02-14 — End: 2023-02-14
  Administered 2023-02-14: 30 mg via INTRAVENOUS
  Filled 2023-02-14: qty 1

## 2023-02-14 MED ORDER — SODIUM CHLORIDE 0.9 % IV BOLUS
1000.0000 mL | Freq: Once | INTRAVENOUS | Status: AC
Start: 2023-02-14 — End: 2023-02-14
  Administered 2023-02-14: 1000 mL via INTRAVENOUS

## 2023-02-14 NOTE — Discharge Instructions (Signed)
Please begin taking antibiotic today as scheduled for the next 10 days.  Please use Tylenol or ibuprofen as needed for any additional headache.  Return to the emergency department for any significant headache, fever or any other symptom personally concerning to result.

## 2023-02-14 NOTE — ED Provider Notes (Signed)
[facility] Provider Note    Event Date/Time   First MD Initiated Contact with Patient 02/14/23 [phone]     (approximate)  History   Chief Complaint: Headache  HPI  Jeremiah Bright is a 52 y.o. male who is blind presents to the emergency department for headache.  According to the patient family member patient has a history of intermittent headaches however today for the past few hours he has had a sharp severe headache.  States a history of migraine-like headaches that occur intermittently.  Denies any vomiting or fever.  Patient states he has never had any imaging for his headaches previously.  States when they do flareup Tylenol does not seem to help.  Physical Exam   Triage Vital Signs: ED Triage Vitals [02/13/23 2144]  Enc Vitals Group     BP (!) 142/98     Pulse Rate 92     Resp 16     Temp 98 F (36.7 C)     Temp Source Oral     SpO2 98 %     Weight 227 lb 1.2 oz (103 kg)     Height 6\' 3"  (1.905 m)     Head Circumference      Peak Flow      Pain Score 8     Pain Loc      Pain Edu?      Excl. in GC?     Most recent vital signs: Vitals:   02/13/23 2144  BP: (!) 142/98  Pulse: 92  Resp: 16  Temp: 98 F (36.7 C)  SpO2: 98%    General: Awake, no distress.  CV:  Good peripheral perfusion.  Regular rate and rhythm  Resp:  Normal effort.  Equal breath sounds bilaterally.  Abd:  No distention.    ED Results / Procedures / Treatments   RADIOLOGY  I reviewed and interpreted CT head images.  No obvious bleed or significant abnormality seen within the brain. Radiology is read the CT scan is negative for acute intracranial abnormality but does have right frontal sinusitis   MEDICATIONS ORDERED IN ED: Medications  ketorolac (TORADOL) 30 MG/ML injection 30 mg (has no administration in time range)  metoCLOPramide (REGLAN) injection 10 mg (has no administration in time range)  diphenhydrAMINE (BENADRYL) injection 50 mg (has no  administration in time range)  sodium chloride 0.9 % bolus 1,000 mL (has no administration in time range)  acetaminophen (TYLENOL) tablet 1,000 mg (1,000 mg Oral Given 02/13/23 2251)     IMPRESSION / MDM / ASSESSMENT AND PLAN / ED COURSE  I reviewed the triage vital signs and the nursing notes.  Patient's presentation is most consistent with acute presentation with potential threat to life or bodily function.  Patient presents emergency department for a headache.  Patient states has been intermittent over the past 1 week but worse over the past few hours tonight.  Tylenol has not been helping.  Patient denies any prior imaging.  Given the patient's worsening headache will obtain a CT scan of the head as a precaution to rule intracranial abnormality.  We will treat with Toradol Reglan Benadryl IV fluids and continue to closely monitor.  Overall patient appears well, no distress.  CT negative for acute intracranial abnormality but does show right frontal sinusitis which correlates to the location of the majority of the patient's headache.  We will treat with antibiotics also recommended over-the-counter decongestant such as pseudoephedrine.  Patient receiving migraine cocktail currently.  Patient is feeling much better after migraine cocktail.  States he is ready go home.  Will discharge with Augmentin for presumed sinusitis based off the CT imaging.  Patient agreeable to plan of care.  FINAL CLINICAL IMPRESSION(S) / ED DIAGNOSES   Headache  Note:  This document was prepared using Dragon voice recognition software and may include unintentional dictation errors.   Minna Antis, MD 02/14/23 [phone]

## 2023-03-17 ENCOUNTER — Emergency Department: Payer: Medicaid Other

## 2023-03-17 ENCOUNTER — Inpatient Hospital Stay
Admission: EM | Admit: 2023-03-17 | Discharge: 2023-03-21 | DRG: 871 | Disposition: A | Discharge status: Home / Self Care | Payer: Medicaid Other | Attending: Internal Medicine | Admitting: Internal Medicine

## 2023-03-17 ENCOUNTER — Inpatient Hospital Stay: Payer: Medicaid Other

## 2023-03-17 ENCOUNTER — Other Ambulatory Visit: Payer: Self-pay

## 2023-03-17 DIAGNOSIS — Z833 Family history of diabetes mellitus: Secondary | ICD-10-CM

## 2023-03-17 DIAGNOSIS — N390 Urinary tract infection, site not specified: Secondary | ICD-10-CM | POA: Diagnosis present

## 2023-03-17 DIAGNOSIS — F17200 Nicotine dependence, unspecified, uncomplicated: Secondary | ICD-10-CM | POA: Diagnosis present

## 2023-03-17 DIAGNOSIS — N179 Acute kidney failure, unspecified: Secondary | ICD-10-CM | POA: Diagnosis present

## 2023-03-17 DIAGNOSIS — B9620 Unspecified Escherichia coli [E. coli] as the cause of diseases classified elsewhere: Secondary | ICD-10-CM | POA: Diagnosis present

## 2023-03-17 DIAGNOSIS — J9601 Acute respiratory failure with hypoxia: Secondary | ICD-10-CM | POA: Diagnosis present

## 2023-03-17 DIAGNOSIS — R918 Other nonspecific abnormal finding of lung field: Secondary | ICD-10-CM | POA: Diagnosis present

## 2023-03-17 DIAGNOSIS — J432 Centrilobular emphysema: Secondary | ICD-10-CM | POA: Diagnosis present

## 2023-03-17 DIAGNOSIS — R631 Polydipsia: Secondary | ICD-10-CM | POA: Diagnosis present

## 2023-03-17 DIAGNOSIS — R739 Hyperglycemia, unspecified: Secondary | ICD-10-CM | POA: Diagnosis present

## 2023-03-17 DIAGNOSIS — R32 Unspecified urinary incontinence: Secondary | ICD-10-CM | POA: Diagnosis present

## 2023-03-17 DIAGNOSIS — R6520 Severe sepsis without septic shock: Secondary | ICD-10-CM | POA: Diagnosis present

## 2023-03-17 DIAGNOSIS — J439 Emphysema, unspecified: Secondary | ICD-10-CM | POA: Diagnosis present

## 2023-03-17 DIAGNOSIS — J9811 Atelectasis: Secondary | ICD-10-CM | POA: Diagnosis present

## 2023-03-17 DIAGNOSIS — Z7951 Long term (current) use of inhaled steroids: Secondary | ICD-10-CM | POA: Diagnosis not present

## 2023-03-17 DIAGNOSIS — R350 Frequency of micturition: Secondary | ICD-10-CM | POA: Diagnosis present

## 2023-03-17 DIAGNOSIS — J438 Other emphysema: Secondary | ICD-10-CM | POA: Diagnosis present

## 2023-03-17 DIAGNOSIS — H543 Unqualified visual loss, both eyes: Secondary | ICD-10-CM | POA: Diagnosis present

## 2023-03-17 DIAGNOSIS — R7309 Other abnormal glucose: Secondary | ICD-10-CM | POA: Diagnosis present

## 2023-03-17 DIAGNOSIS — K439 Ventral hernia without obstruction or gangrene: Secondary | ICD-10-CM | POA: Diagnosis present

## 2023-03-17 DIAGNOSIS — A419 Sepsis, unspecified organism: Principal | ICD-10-CM | POA: Diagnosis present

## 2023-03-17 DIAGNOSIS — Z720 Tobacco use: Secondary | ICD-10-CM | POA: Diagnosis present

## 2023-03-17 DIAGNOSIS — R683 Clubbing of fingers: Secondary | ICD-10-CM | POA: Diagnosis present

## 2023-03-17 DIAGNOSIS — K529 Noninfective gastroenteritis and colitis, unspecified: Secondary | ICD-10-CM | POA: Diagnosis present

## 2023-03-17 DIAGNOSIS — H547 Unspecified visual loss: Secondary | ICD-10-CM

## 2023-03-17 DIAGNOSIS — E878 Other disorders of electrolyte and fluid balance, not elsewhere classified: Secondary | ICD-10-CM | POA: Diagnosis present

## 2023-03-17 DIAGNOSIS — R109 Unspecified abdominal pain: Secondary | ICD-10-CM | POA: Diagnosis present

## 2023-03-17 DIAGNOSIS — N2889 Other specified disorders of kidney and ureter: Secondary | ICD-10-CM | POA: Diagnosis present

## 2023-03-17 DIAGNOSIS — E876 Hypokalemia: Secondary | ICD-10-CM | POA: Diagnosis present

## 2023-03-17 LAB — COMPREHENSIVE METABOLIC PANEL
ALT: 12 U/L (ref 0–44)
AST: 11 U/L — ABNORMAL LOW (ref 15–41)
Albumin: 3.6 g/dL (ref 3.5–5.0)
Alkaline Phosphatase: 43 U/L (ref 38–126)
Anion gap: 9 (ref 5–15)
BUN: 12 mg/dL (ref 6–20)
CO2: 20 mmol/L — ABNORMAL LOW (ref 22–32)
Calcium: 8.3 mg/dL — ABNORMAL LOW (ref 8.9–10.3)
Chloride: 107 mmol/L (ref 98–111)
Creatinine, Ser: 1.25 mg/dL — ABNORMAL HIGH (ref 0.61–1.24)
GFR, Estimated: >60 mL/min (ref >60)
Glucose, Bld: 146 mg/dL — ABNORMAL HIGH (ref 70–99)
Potassium: 3.1 mmol/L — ABNORMAL LOW (ref 3.5–5.1)
Sodium: 136 mmol/L (ref 135–145)
Total Bilirubin: 2 mg/dL — ABNORMAL HIGH (ref 0.3–1.2)
Total Protein: 7.1 g/dL (ref 6.5–8.1)

## 2023-03-17 LAB — CBC WITH DIFFERENTIAL/PLATELET
Abs Immature Granulocytes: 0.03 10*3/uL (ref 0.00–0.07)
Basophils Absolute: 0 10*3/uL (ref 0.0–0.1)
Basophils Relative: 0 %
Eosinophils Absolute: 0 10*3/uL (ref 0.0–0.5)
Eosinophils Relative: 0 %
HCT: 46 % (ref 39.0–52.0)
Hemoglobin: 16 g/dL (ref 13.0–17.0)
Immature Granulocytes: 0 %
Lymphocytes Relative: 9 %
Lymphs Abs: 1 10*3/uL (ref 0.7–4.0)
MCH: 32.7 pg (ref 26.0–34.0)
MCHC: 34.8 g/dL (ref 30.0–36.0)
MCV: 93.9 fL (ref 80.0–100.0)
Monocytes Absolute: 0.7 10*3/uL (ref 0.1–1.0)
Monocytes Relative: 6 %
Neutro Abs: 10.3 10*3/uL — ABNORMAL HIGH (ref 1.7–7.7)
Neutrophils Relative %: 85 %
Platelets: 187 10*3/uL (ref 150–400)
RBC: 4.9 MIL/uL (ref 4.22–5.81)
RDW: 11.9 % (ref 11.5–15.5)
WBC: 12.1 10*3/uL — ABNORMAL HIGH (ref 4.0–10.5)
nRBC: 0 % (ref 0.0–0.2)

## 2023-03-17 LAB — URINALYSIS, W/ REFLEX TO CULTURE (INFECTION SUSPECTED)
Bacteria, UA: NONE SEEN
Bilirubin Urine: NEGATIVE
Glucose, UA: NEGATIVE mg/dL
Ketones, ur: NEGATIVE mg/dL
Nitrite: POSITIVE — AB
Protein, ur: >=300 mg/dL — AB
RBC / HPF: >50 RBC/hpf (ref 0–5)
Specific Gravity, Urine: 1.017 (ref 1.005–1.030)
WBC, UA: >50 WBC/hpf (ref 0–5)
pH: 5 (ref 5.0–8.0)

## 2023-03-17 LAB — CHLAMYDIA/NGC RT PCR (ARMC ONLY)
Chlamydia Tr: NOT DETECTED
N gonorrhoeae: NOT DETECTED

## 2023-03-17 LAB — C DIFFICILE QUICK SCREEN W PCR REFLEX
C Diff antigen: NEGATIVE
C Diff interpretation: NOT DETECTED
C Diff toxin: NEGATIVE

## 2023-03-17 LAB — URINE CULTURE

## 2023-03-17 LAB — LACTIC ACID, PLASMA: Lactic Acid, Venous: 1.4 mmol/L (ref 0.5–1.9)

## 2023-03-17 LAB — LIPASE, BLOOD: Lipase: 24 U/L (ref 11–51)

## 2023-03-17 MED ORDER — NICOTINE 21 MG/24HR TD PT24
21.0000 mg | MEDICATED_PATCH | Freq: Every day | TRANSDERMAL | Status: DC
  Administered 2023-03-17 – 2023-03-21 (×5): 21 mg via TRANSDERMAL
  Filled 2023-03-17 (×5): qty 1

## 2023-03-17 MED ORDER — PANTOPRAZOLE SODIUM 40 MG IV SOLR
40.0000 mg | Freq: Two times a day (BID) | INTRAVENOUS | Status: DC
  Administered 2023-03-17 – 2023-03-18 (×2): 40 mg via INTRAVENOUS
  Filled 2023-03-17 (×2): qty 10

## 2023-03-17 MED ORDER — PREDNISONE 20 MG PO TABS
40.0000 mg | ORAL_TABLET | Freq: Every day | ORAL | Status: DC
  Administered 2023-03-18: 40 mg via ORAL
  Filled 2023-03-17: qty 2

## 2023-03-17 MED ORDER — IOHEXOL 300 MG/ML  SOLN
100.0000 mL | Freq: Once | INTRAMUSCULAR | Status: AC | PRN
  Administered 2023-03-17: 100 mL via INTRAVENOUS

## 2023-03-17 MED ORDER — ACETAMINOPHEN 650 MG RE SUPP: 650.0000 mg | Freq: Four times a day (QID) | RECTAL | Status: DC | PRN

## 2023-03-17 MED ORDER — KETOROLAC TROMETHAMINE 15 MG/ML IJ SOLN
15.0000 mg | Freq: Once | INTRAMUSCULAR | Status: AC
  Administered 2023-03-17: 15 mg via INTRAVENOUS
  Filled 2023-03-17: qty 1

## 2023-03-17 MED ORDER — HYDROCODONE-ACETAMINOPHEN 5-325 MG PO TABS
1.0000 | ORAL_TABLET | ORAL | Status: DC | PRN
  Administered 2023-03-18 – 2023-03-20 (×6): 1 via ORAL
  Filled 2023-03-17 (×6): qty 1

## 2023-03-17 MED ORDER — MORPHINE SULFATE (PF) 2 MG/ML IV SOLN
2.0000 mg | INTRAVENOUS | Status: DC | PRN
  Administered 2023-03-20: 2 mg via INTRAVENOUS
  Filled 2023-03-17: qty 1

## 2023-03-17 MED ORDER — ACETAMINOPHEN 500 MG PO TABS
1000.0000 mg | ORAL_TABLET | Freq: Once | ORAL | Status: AC
  Administered 2023-03-17: 1000 mg via ORAL
  Filled 2023-03-17: qty 2

## 2023-03-17 MED ORDER — SODIUM CHLORIDE 0.9 % IV SOLN: INTRAVENOUS | Status: AC

## 2023-03-17 MED ORDER — MORPHINE SULFATE (PF) 4 MG/ML IV SOLN
4.0000 mg | Freq: Once | INTRAVENOUS | Status: AC
  Administered 2023-03-17: 4 mg via INTRAVENOUS
  Filled 2023-03-17: qty 1

## 2023-03-17 MED ORDER — SODIUM CHLORIDE 0.9 % IV BOLUS
1000.0000 mL | Freq: Once | INTRAVENOUS | Status: AC
  Administered 2023-03-17: 1000 mL via INTRAVENOUS

## 2023-03-17 MED ORDER — IPRATROPIUM-ALBUTEROL 0.5-2.5 (3) MG/3ML IN SOLN
3.0000 mL | Freq: Three times a day (TID) | RESPIRATORY_TRACT | Status: DC
  Filled 2023-03-17: qty 3

## 2023-03-17 MED ORDER — ONDANSETRON HCL 4 MG/2ML IJ SOLN
4.0000 mg | Freq: Once | INTRAMUSCULAR | Status: AC
  Administered 2023-03-17: 4 mg via INTRAVENOUS
  Filled 2023-03-17: qty 2

## 2023-03-17 MED ORDER — SODIUM CHLORIDE 0.9% FLUSH
3.0000 mL | Freq: Two times a day (BID) | INTRAVENOUS | Status: DC
  Administered 2023-03-18 – 2023-03-21 (×7): 3 mL via INTRAVENOUS

## 2023-03-17 MED ORDER — HEPARIN SODIUM (PORCINE) 5000 UNIT/ML IJ SOLN
5000.0000 [IU] | Freq: Three times a day (TID) | INTRAMUSCULAR | Status: DC
  Administered 2023-03-18 – 2023-03-21 (×10): 5000 [IU] via SUBCUTANEOUS
  Filled 2023-03-17 (×10): qty 1

## 2023-03-17 MED ORDER — ACETAMINOPHEN 325 MG PO TABS
650.0000 mg | ORAL_TABLET | Freq: Four times a day (QID) | ORAL | Status: DC | PRN
  Administered 2023-03-19 – 2023-03-20 (×3): 650 mg via ORAL
  Filled 2023-03-17 (×3): qty 2

## 2023-03-17 MED ORDER — SODIUM CHLORIDE 0.9 % IV SOLN
1.0000 g | INTRAVENOUS | Status: AC
  Administered 2023-03-18 – 2023-03-20 (×3): 1 g via INTRAVENOUS
  Filled 2023-03-17 (×3): qty 10

## 2023-03-17 MED ORDER — SODIUM CHLORIDE 0.9 % IV SOLN
1.0000 g | Freq: Once | INTRAVENOUS | Status: AC
  Administered 2023-03-17: 1 g via INTRAVENOUS
  Filled 2023-03-17: qty 10

## 2023-03-17 MED ORDER — POTASSIUM CHLORIDE CRYS ER 20 MEQ PO TBCR
40.0000 meq | EXTENDED_RELEASE_TABLET | Freq: Once | ORAL | Status: AC
  Administered 2023-03-17: 40 meq via ORAL
  Filled 2023-03-17: qty 2

## 2023-03-17 NOTE — Assessment & Plan Note (Signed)
Lab Results  Component Value Date   CREATININE 1.25 (H) 03/17/2023   CREATININE 0.98 01/24/2022   CREATININE 1.04 01/24/2022  Question if this is related to the urinary tract infection or the renal mass. We will continue with IV antibiotics and follow-up on culture sensitivity. Urology consult and/or nephrology consult as deemed appropriate thereafter.

## 2023-03-17 NOTE — Assessment & Plan Note (Signed)
Generalized abdominal pain could be from his enteritis or has renal mass or peptic ulcer disease.  Patient needs evaluation by GI for his enteritis.  While in the hospital we will treat for the urinary tract infection.  Urology services for renal mass as deemed appropriate for ideally after an MRI and outpatient urology consult.

## 2023-03-17 NOTE — ED Notes (Signed)
Pt taken to bathroom and provided a urinal per request after stating he really needed to urinate. Pt unable to provide urinae sample at this time.

## 2023-03-17 NOTE — Assessment & Plan Note (Signed)
Patient has no history of chronic respiratory failure or home O2 on as needed basis. Patient needs pulmonology consult once stable for evaluation of the same.  Patient may benefit from high-resolution chest CT but will defer that to pulmonary for evaluation of his clubbing and respiratory failure.  Patient currently continued on 2 L nasal cannula.
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
## 2023-03-17 NOTE — Assessment & Plan Note (Signed)
Replace and follow levels for all electrolytes.
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
## 2023-03-17 NOTE — Assessment & Plan Note (Signed)
Duoneb and Prn albuterol. Tobacco cessation education.

## 2023-03-17 NOTE — Assessment & Plan Note (Signed)
2/2 to UTI. We will cont with IV abx.

## 2023-03-17 NOTE — Assessment & Plan Note (Signed)
Assistance with ambulation

## 2023-03-17 NOTE — Assessment & Plan Note (Addendum)
Pt has had similar finding on prior ct will need GI Consult on nonurgent or outpatient basis for enteritis documented in CT abd . No diarrhea or Bowel movement change.

## 2023-03-17 NOTE — Assessment & Plan Note (Signed)
Patient meets sepsis secondary to UTI with organ dysfunction AKI. Patient started on IV antibiotic treatment. Continuous IV fluids. Follow culture sensitivity further tailor management.

## 2023-03-17 NOTE — Assessment & Plan Note (Signed)
Again pt needs pulmonary referral.

## 2023-03-17 NOTE — Assessment & Plan Note (Signed)
Nicotine patch . Tobacco cessation education.

## 2023-03-17 NOTE — Assessment & Plan Note (Signed)
?   If this has any pulmonary issue or ILD. Pulmonary referral once stable and or upon discharge.
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
## 2023-03-17 NOTE — Assessment & Plan Note (Addendum)
Pt needs MRI which we will order fro am and follow creatinine.  Urology Consult per Am team .

## 2023-03-17 NOTE — Assessment & Plan Note (Signed)
Will obtain an A1c.

## 2023-03-18 DIAGNOSIS — R350 Frequency of micturition: Secondary | ICD-10-CM | POA: Diagnosis not present

## 2023-03-18 LAB — CBC
HCT: 41 % (ref 39.0–52.0)
Hemoglobin: 14.3 g/dL (ref 13.0–17.0)
MCH: 33 pg (ref 26.0–34.0)
MCHC: 34.9 g/dL (ref 30.0–36.0)
MCV: 94.7 fL (ref 80.0–100.0)
Platelets: 142 10*3/uL — ABNORMAL LOW (ref 150–400)
RBC: 4.33 MIL/uL (ref 4.22–5.81)
RDW: 11.9 % (ref 11.5–15.5)
WBC: 11 10*3/uL — ABNORMAL HIGH (ref 4.0–10.5)
nRBC: 0 % (ref 0.0–0.2)

## 2023-03-18 LAB — COMPREHENSIVE METABOLIC PANEL
ALT: 13 U/L (ref 0–44)
AST: 13 U/L — ABNORMAL LOW (ref 15–41)
Albumin: 2.7 g/dL — ABNORMAL LOW (ref 3.5–5.0)
Alkaline Phosphatase: 42 U/L (ref 38–126)
Anion gap: 7 (ref 5–15)
BUN: 15 mg/dL (ref 6–20)
CO2: 21 mmol/L — ABNORMAL LOW (ref 22–32)
Calcium: 7.7 mg/dL — ABNORMAL LOW (ref 8.9–10.3)
Chloride: 109 mmol/L (ref 98–111)
Creatinine, Ser: 1.27 mg/dL — ABNORMAL HIGH (ref 0.61–1.24)
GFR, Estimated: >60 mL/min (ref >60)
Glucose, Bld: 135 mg/dL — ABNORMAL HIGH (ref 70–99)
Potassium: 3.8 mmol/L (ref 3.5–5.1)
Sodium: 137 mmol/L (ref 135–145)
Total Bilirubin: 0.7 mg/dL (ref 0.3–1.2)
Total Protein: 5.9 g/dL — ABNORMAL LOW (ref 6.5–8.1)

## 2023-03-18 LAB — HEMOGLOBIN A1C
Hgb A1c MFr Bld: 5.2 % (ref 4.8–5.6)
Mean Plasma Glucose: 102.54 mg/dL

## 2023-03-18 LAB — LACTIC ACID, PLASMA
Lactic Acid, Venous: 3.3 mmol/L (ref 0.5–1.9)
Lactic Acid, Venous: 0.9 mmol/L (ref 0.5–1.9)

## 2023-03-18 LAB — GASTROINTESTINAL PANEL BY PCR, STOOL (REPLACES STOOL CULTURE)

## 2023-03-18 LAB — HIV ANTIBODY (ROUTINE TESTING W REFLEX): HIV Screen 4th Generation wRfx: NONREACTIVE

## 2023-03-18 LAB — URINE CULTURE

## 2023-03-18 LAB — PHOSPHORUS: Phosphorus: 2.6 mg/dL (ref 2.5–4.6)

## 2023-03-18 LAB — MAGNESIUM: Magnesium: 1.7 mg/dL (ref 1.7–2.4)

## 2023-03-18 MED ORDER — PANTOPRAZOLE SODIUM 40 MG PO TBEC
40.0000 mg | DELAYED_RELEASE_TABLET | Freq: Two times a day (BID) | ORAL | Status: DC
  Administered 2023-03-18 – 2023-03-21 (×6): 40 mg via ORAL
  Filled 2023-03-18 (×6): qty 1

## 2023-03-18 MED ORDER — IPRATROPIUM-ALBUTEROL 0.5-2.5 (3) MG/3ML IN SOLN: 3.0000 mL | Freq: Four times a day (QID) | RESPIRATORY_TRACT | Status: DC | PRN

## 2023-03-18 NOTE — Progress Notes (Signed)
SVN changed to Q6 prn Pt states he has no underlyinging resp issues and is not SOB. He agrees that prn is more appropriate than scheduled txs

## 2023-03-18 NOTE — Plan of Care (Signed)
  Problem: Education: Goal: Knowledge of General Education information will improve Description: Including pain rating scale, medication(s)/side effects and non-pharmacologic comfort measures Outcome: Completed/Met   Problem: Nutrition: Goal: Adequate nutrition will be maintained Outcome: Completed/Met   Problem: Coping: Goal: Level of anxiety will decrease Outcome: Progressing   Problem: Skin Integrity: Goal: Risk for impaired skin integrity will decrease Outcome: Completed/Met   Problem: Coping: Goal: Level of anxiety will decrease Outcome: Completed/Met   Problem: Elimination: Goal: Will not experience complications related to bowel motility Outcome: Completed/Met   Problem: Pain Managment: Goal: General experience of comfort will improve Outcome: Progressing

## 2023-03-18 NOTE — TOC CM/SW Note (Signed)
Patient flagged for SDOH (food, housing, utilities, transportation). Resources added to AVS.  Jeremiah Ramus, LCSW Transitions of Care Department [phone]

## 2023-03-18 NOTE — Discharge Instructions (Signed)
Food Resources  Agency Name: [facility] Agency Address: [address] [place], [place] [address] Phone: [phone] Website: [email or web address] Service(s) Offered: Housing services, self-sufficiency, congregate meal program, weatherization program, Event organiser program, emergency food assistance,  housing counseling, home ownership program, wheels - to work program.  [employer] free for 60 and older at various locations from [employer], Monday-Friday:  [employer], [address] [place], [phone] -[facility], [address], [place] [phone]  -[facility], [address], [place] [phone]  -[address] [place], [phone]  Agency Name: [facility] Address: [phone] [address], [place], [place] [address] Phone: [phone] Website: [email or web address] Service(s) Offered: Home delivered hot, frozen, and emergency  meals. Grocery assistance program which matches  volunteers one-on-one with seniors unable to grocery shop  for themselves. Must be 60 years and older; less than 20  hours of in-home aide service, limited or no driving ability;  live alone or with someone with a disability; live in  [place].  Agency Name: [facility]) Address: [address], [place], [place] [address] Phone: [phone] Service(s) Offered: Food is served to shut-ins, homeless, elderly, and low income people in the community every Saturday (11:30 am-12:30 pm) and Sunday (12:30 pm-1:30pm). Volunteers also offer help and encouragement in seeking employment,  and spiritual guidance.  Agency Name: [facility] Social Services Address: [address] [place], [place] [address] Phone: [phone] Service(s) Offered: Child support services; child welfare services; food stamps; Medicaid; work first family assistance; and aid  with fuel,  rent, food and medicine.  Agency Name: [facility] Address: [address] [place], [place] Phone: [phone] Website: [email or web address] Services Offered: Monday 10:00am-12:00, 8:00pm-9:00pm, and Friday 10:00am-12:00.  Agency Name: [facility] [employer] [facility] Address: [address] [place], [place] [address] Phone: [phone] Website: [email or web address] Service(s) Offered: Serves weekday meals, open from 11:30 am- 1:00 pm., and 6:30-7:30pm, Monday-Wednesday-Friday distributes food 3:30-6pm, Monday-Wednesday-Friday.  Agency Name: [facility] Address: [address], [place], [place] Phone: [phone] Website: [email or web address] Services Offered: Distributes food the 4th Saturday of the month, starting at 8:00 am  Agency Name: [facility] Address: [phone] [address] [place], [place] [address] Phone: [phone] Website: [email or web address] Service(s) Offered: Bread of life, weekly food pantry. Open Wednesdays from 10:00am-noon.  Agency Name: [facility] [employer] Address: [address] [place], [place], [place] Phone: [phone] Services Offered: Distributes food 9am-1pm, Monday-Thursday. Call for details.  Agency Name: [facility] Address: [address], [place], [place] [address] Phone: [phone] Website: [email or web address] Service(s) Offered: Games developer. Call for assistance.  Agency Name: [place] Address: [address] [place], [place] [address] Phone: [phone] Service Offered: Emergency Food Pantry. Call for appointment.  Agency Name: [facility] Address: [address], [place], [place] [address] Phone: [phone] Website: [email or web address] Services Offered: Games developer. Call for details  Agency Name: [facility] Address: [address] [place], [place] Phone:  [phone] Website: [email or web address] Service(s) Offered: Emergency Food Pantry. Call for details.  Agency Name: Holiday representative Address: [address], [place], [place] [address] Phone: [phone] or [phone] Website: [email or web address] Service(s) Offered: Distribute food 9am-11:30 am, Tuesday-Friday, and 1-3:30pm, Monday-Friday. Food pantry Monday-Friday 1pm-3pm, fresh items, Mon.-Wed.-Fri.  Agency Name: [facility] Address: [address] [place], [place] [address] Phone: [phone] Website: www.[email or web address] Services Offered: Distribute food Tues and Sats from 9:00am-noon.  Closed 1st Saturday of each month. Call for details     Rent/Utility/Housing  Agency Name: [facility] Agency Address: [address] [place], [place] [address] Phone: [phone] Email: [email or web address] Website: [email or web address] Service(s) Offered: Housing services, self-sufficiency, congregate meal program, weatherization program, Field seismologist program, emergency food assistance,  housing counseling, home ownership program, wheels -towork program.  Agency Name: [facility] Address: [address] [place], [place] [address] Phone: [phone] (8a-4p) [phone] (8p- 10p) Email: [email or web address] Website: [email or web address] Service(s) Offered: A program for homeless and/or needy men that includes one-on-one counseling, life skills training and job rehabilitation.  Agency Name: [facility] [place] Address: [address] [place], [place] [address] Phone: [phone] Website: www.[email or web address] Service(s) Offered: Assistance to needy in emergency with utility bills, heating fuel, and prescriptions. Shelter for homeless 7pm-7am. January 06, 2017 15  Agency Name: [facility] [place] [facility] Address: [address], [place], [place] [address] Phone: [phone] Contact Person: [place] Email: [email or web address] Website: [email or web address] Service(s) Offered: Helps individuals with developmental disabilities move from housing that is more restrictive to homes where they  can achieve greater independence and have more  opportunities.  Agency Name: [facility] Address: [address], [place], [place] [address] Phone: [phone] Email: [email or web address] Website: www.[email or web address].org Service(s) Offered: Provides affordable housing for low-income families, elderly, and disabled individuals. Offer a wide range of  programs and services, from financial planning to afterschool and summer programs.  Agency Name: Department of Social Services Address: [address] [place], [place] [address] Phone: [phone] Service(s) Offered: Child support services; child welfare services; food stamps; Medicaid; work first family assistance; and aid with fuel,  rent, food and medicine.  Agency Name: [facility] [place] [employer] Address: [facility] [address] [place], [place]  [address] Phone: [phone] Website: www.familyabuseservices.org Service(s) Offered: 24 hour Crisis Line: [phone]; 24 hour Emergency Shelter; Transitional Housing; Support Groups; Scientist, physiological; [employer]; Hispanic Outreach: [phone];  Visitation Center: [phone].  Agency Name: [facility]. Address: [address] [place], [place] [address] Phone: [phone] Service(s) Offered: CAP Services; Home and [employer]; Individual or Group Supports; Respite Care Non-Institutional Nursing;  Residential Supports; Respite Care and Personal Care Services; Transportation; Family and Friends Night; Recreational Activities; Three Nutritious Meals/Snacks; Consultation with Registered Dietician; Twenty-four hour Registered Nurse  Access; Daily and [employer]; Camp [employer]aves; [facility] for the [employer] (During Summer Months) Bingo Night (Every  Wednesday Night); Special Populations Dance Night  (Every Tuesday Night); Professional Hair Care Services.  Agency Name: [facility] Address: [address], [place], [place] [address] Phone: [phone] Contact Person: [place] Website: [email or web address].Physicist, medical) Offered: Residential treatment facility for women; food and  clothing, educational & employment development and  transportation to work; Counsellor of financial skills;  parenting and family reunification; emotional and spiritual  support; transitional housing for program graduates.  Agency Name: [facility] Address: [address] [place], [place] [address] Phone: [phone] Email: [email or web address] Website: [email or web address].es Service(s) Offered: Public housing units for elderly, disabled, and low income people; housing choice vouchers for income eligible  applicants; shelter plus care vouchers; and Psychologist, clinical.  Agency Name: [facility] [employer] Address: [address] [place], [place] [address] Phone: [phone] Email: [email or web address] Website: www.habitatalamance.org Service(s) Offered: Build houses for families in need of decent housing. Each adult in the family must invest 200 hours of labor on  someone else's house, work with volunteers to build their own house, attend classes on budgeting, home maintenance, yard care, and attend homeowner association meetings.  Agency Name: [facility] Address: [address], [place], [place] [address] Phone: [phone] Website: www.rsli.org Service(s) Offered: Intermediate care facilities for intellectually delayed, Supervised Living in group homes for adults with developmental disabilities, Supervised Living for  people who have dual diagnoses (MRMI), Independent Living, Supported Living, respite and a variety of CAP services, pre-vocational services, day supports, and [employer].  Agency Name: [place] [facility] Phone: [phone] Website: www.NCForeclosurePrevention.gov Service(s) Offered: Zero-interest, deferred loans to homeowners struggling to pay their mortgage. Call for more information.     Transportation Resources  Agency Name: [facility] Agency Address: [address] [place], [place] [address] Phone: [phone] Email: [email or web address] Website: www.[email or web address].org Service(s) Offered: Housing services, self-sufficiency, congregate meal program, weatherization program, Field seismologist program, emergency food assistance,  housing counseling, home ownership program, wheels-towork program.  Agency Name: [facility] [id number]) Address: [address], [place], [place] [address] Phone: [phone] Website: www.acta-[place]atna.[email or web address] Service(s) Offered: Transportation for [employer], subscription and demand response; Dial-a-Ride for citizens 33 years of age or older.  Agency Name: Department of Social Services Address: [address] [place], [place] [address] Phone: [phone] Service(s) Offered: Child support services; child welfare services; food stamps; Medicaid; work first family assistance; and aid with fuel,  rent, food and medicine, transportation assistance.  Agency Name: Disabled [employer] (DAV) Transportation  Network Phone: [phone] Service(s) Offered: Transports veterans to the [facility] medical center. Call  forty-eight hours in advance and leave the name, telephone  number, date, and time of appointment. Veteran will be  contacted by the driver the day before the appointment to  arrange a pick up point     Agency Name: [facility] Agency Address: [address], [place], [place] [address] Phone: [phone] Website: www.[email or web address].org Service(s) Offered: Housing services, self-sufficiency, congregate meal program, and individual development account program.  Agency Name: [facility] [place] Address: [address] [place], [place] [address] Phone: [phone] Email: [email or web address] Website: www.[email or web address] Service(s) Offered: Housing the homeless, feeding the hungry, Company secretary, job and education related services.  Agency Name: [facility] Address: [address] [place], [place] [address] Phone: [phone] Email: [email or web address] Service(s) Offered: Counseling, problem pregnancy, advocacy for Hispanics, limited emergency financial assistance.  Agency Name: Department of Social Services Address: [address] [place], [place] [address] Phone: [phone] Website: [email or web address] Service(s) Offered: Child support services; child welfare services; SNAP; Medicaid; work first family assistance; and aid with fuel,  rent, food and medicine.  Agency Name: [facility] Address: [address] [place], [place] [address] Phone: [phone] or [phone] Email: [email or web address] Service(s) Offered: Family services and transient assistance; emergency food, fuel, clothing, limited furniture, utilities; budget counseling, general counseling; give a kid a coat; thrift store; Christmas food and toys. Utility assistance, food pantry, rental  assistance, life sustaining medicine
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
## 2023-03-19 DIAGNOSIS — R350 Frequency of micturition: Secondary | ICD-10-CM | POA: Diagnosis not present

## 2023-03-19 LAB — BASIC METABOLIC PANEL
Anion gap: 7 (ref 5–15)
BUN: 19 mg/dL (ref 6–20)
CO2: 22 mmol/L (ref 22–32)
Calcium: 8.4 mg/dL — ABNORMAL LOW (ref 8.9–10.3)
Chloride: 107 mmol/L (ref 98–111)
Creatinine, Ser: 1.41 mg/dL — ABNORMAL HIGH (ref 0.61–1.24)
GFR, Estimated: 59 mL/min — ABNORMAL LOW (ref >60)
Glucose, Bld: 190 mg/dL — ABNORMAL HIGH (ref 70–99)
Potassium: 4.1 mmol/L (ref 3.5–5.1)
Sodium: 136 mmol/L (ref 135–145)

## 2023-03-19 LAB — CBC WITH DIFFERENTIAL/PLATELET
Abs Immature Granulocytes: 0.07 10*3/uL (ref 0.00–0.07)
Basophils Absolute: 0 10*3/uL (ref 0.0–0.1)
Basophils Relative: 0 %
Eosinophils Absolute: 0 10*3/uL (ref 0.0–0.5)
Eosinophils Relative: 0 %
HCT: 37.5 % — ABNORMAL LOW (ref 39.0–52.0)
Hemoglobin: 12.9 g/dL — ABNORMAL LOW (ref 13.0–17.0)
Immature Granulocytes: 1 %
Lymphocytes Relative: 9 %
Lymphs Abs: 1 10*3/uL (ref 0.7–4.0)
MCH: 32.8 pg (ref 26.0–34.0)
MCHC: 34.4 g/dL (ref 30.0–36.0)
MCV: 95.4 fL (ref 80.0–100.0)
Monocytes Absolute: 0.8 10*3/uL (ref 0.1–1.0)
Monocytes Relative: 7 %
Neutro Abs: 9.9 10*3/uL — ABNORMAL HIGH (ref 1.7–7.7)
Neutrophils Relative %: 83 %
Platelets: 143 10*3/uL — ABNORMAL LOW (ref 150–400)
RBC: 3.93 MIL/uL — ABNORMAL LOW (ref 4.22–5.81)
RDW: 11.6 % (ref 11.5–15.5)
WBC: 11.9 10*3/uL — ABNORMAL HIGH (ref 4.0–10.5)
nRBC: 0 % (ref 0.0–0.2)

## 2023-03-19 LAB — URINE CULTURE

## 2023-03-19 MED ORDER — MELATONIN 5 MG PO TABS
5.0000 mg | ORAL_TABLET | Freq: Once | ORAL | Status: AC
  Administered 2023-03-19: 5 mg via ORAL
  Filled 2023-03-19: qty 1
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
## 2023-03-20 DIAGNOSIS — R350 Frequency of micturition: Secondary | ICD-10-CM | POA: Diagnosis not present

## 2023-03-20 LAB — CBC WITH DIFFERENTIAL/PLATELET
Abs Immature Granulocytes: 0.03 10*3/uL (ref 0.00–0.07)
Basophils Absolute: 0 10*3/uL (ref 0.0–0.1)
Basophils Relative: 1 %
Eosinophils Absolute: 0.1 10*3/uL (ref 0.0–0.5)
Eosinophils Relative: 2 %
HCT: 39.2 % (ref 39.0–52.0)
Hemoglobin: 13.4 g/dL (ref 13.0–17.0)
Immature Granulocytes: 0 %
Lymphocytes Relative: 20 %
Lymphs Abs: 1.7 10*3/uL (ref 0.7–4.0)
MCH: 32.1 pg (ref 26.0–34.0)
MCHC: 34.2 g/dL (ref 30.0–36.0)
MCV: 94 fL (ref 80.0–100.0)
Monocytes Absolute: 0.7 10*3/uL (ref 0.1–1.0)
Monocytes Relative: 9 %
Neutro Abs: 5.9 10*3/uL (ref 1.7–7.7)
Neutrophils Relative %: 68 %
Platelets: 181 10*3/uL (ref 150–400)
RBC: 4.17 MIL/uL — ABNORMAL LOW (ref 4.22–5.81)
RDW: 11.8 % (ref 11.5–15.5)
WBC: 8.5 10*3/uL (ref 4.0–10.5)
nRBC: 0 % (ref 0.0–0.2)

## 2023-03-20 LAB — BASIC METABOLIC PANEL
Anion gap: 9 (ref 5–15)
BUN: 19 mg/dL (ref 6–20)
CO2: 21 mmol/L — ABNORMAL LOW (ref 22–32)
Calcium: 8.1 mg/dL — ABNORMAL LOW (ref 8.9–10.3)
Chloride: 107 mmol/L (ref 98–111)
Creatinine, Ser: 1.16 mg/dL (ref 0.61–1.24)
GFR, Estimated: >60 mL/min (ref >60)
Glucose, Bld: 91 mg/dL (ref 70–99)
Potassium: 3.5 mmol/L (ref 3.5–5.1)
Sodium: 137 mmol/L (ref 135–145)

## 2023-03-20 MED ORDER — CEPHALEXIN 500 MG PO CAPS: 500.0000 mg | ORAL_CAPSULE | Freq: Four times a day (QID) | ORAL | Status: DC

## 2023-03-20 MED ORDER — ALUM & MAG HYDROXIDE-SIMETH 200-200-20 MG/5ML PO SUSP
30.0000 mL | ORAL | Status: DC | PRN
  Administered 2023-03-20: 30 mL via ORAL
  Filled 2023-03-20: qty 30
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
## 2023-03-20 NOTE — TOC CM/SW Note (Signed)
Transition of Care [facility]) - Inpatient Brief Assessment   Patient Details  Name: Jeremiah Bright MRN: [id number] Date of Birth: [birth year]  Transition of Care [facility]) CM/SW Contact:    Chapman Fitch, RN Phone Number: 03/20/2023, 10:41 AM   Clinical Narrative:  PCP listed as [facility] Listed as level 6 mobility on RA  Transition of Care Asessment: Insurance and Status: Insurance coverage has been reviewed Patient has primary care physician: Yes     Prior/Current Home Services: No current home services Social Determinants of Health Reivew: SDOH reviewed no interventions necessary Readmission risk has been reviewed: Yes Transition of care needs: no transition of care needs at this time

## 2023-03-21 DIAGNOSIS — R350 Frequency of micturition: Secondary | ICD-10-CM | POA: Diagnosis not present

## 2023-03-21 LAB — CBC WITH DIFFERENTIAL/PLATELET
Abs Immature Granulocytes: 0.08 10*3/uL — ABNORMAL HIGH (ref 0.00–0.07)
Basophils Absolute: 0 10*3/uL (ref 0.0–0.1)
Basophils Relative: 0 %
Eosinophils Absolute: 0.2 10*3/uL (ref 0.0–0.5)
Eosinophils Relative: 2 %
HCT: 40 % (ref 39.0–52.0)
Hemoglobin: 13.8 g/dL (ref 13.0–17.0)
Immature Granulocytes: 1 %
Lymphocytes Relative: 29 %
Lymphs Abs: 2.7 10*3/uL (ref 0.7–4.0)
MCH: 32.4 pg (ref 26.0–34.0)
MCHC: 34.5 g/dL (ref 30.0–36.0)
MCV: 93.9 fL (ref 80.0–100.0)
Monocytes Absolute: 0.8 10*3/uL (ref 0.1–1.0)
Monocytes Relative: 8 %
Neutro Abs: 5.7 10*3/uL (ref 1.7–7.7)
Neutrophils Relative %: 60 %
Platelets: 197 10*3/uL (ref 150–400)
RBC: 4.26 MIL/uL (ref 4.22–5.81)
RDW: 11.9 % (ref 11.5–15.5)
Smear Review: NORMAL
WBC: 9.5 10*3/uL (ref 4.0–10.5)
nRBC: 0 % (ref 0.0–0.2)

## 2023-03-21 LAB — BASIC METABOLIC PANEL
Anion gap: 7 (ref 5–15)
BUN: 17 mg/dL (ref 6–20)
CO2: 25 mmol/L (ref 22–32)
Calcium: 8.4 mg/dL — ABNORMAL LOW (ref 8.9–10.3)
Chloride: 107 mmol/L (ref 98–111)
Creatinine, Ser: 1.25 mg/dL — ABNORMAL HIGH (ref 0.61–1.24)
GFR, Estimated: >60 mL/min (ref >60)
Glucose, Bld: 94 mg/dL (ref 70–99)
Potassium: 3.5 mmol/L (ref 3.5–5.1)
Sodium: 139 mmol/L (ref 135–145)

## 2023-03-21 MED ORDER — PANTOPRAZOLE SODIUM 40 MG PO TBEC: 40.0000 mg | DELAYED_RELEASE_TABLET | Freq: Two times a day (BID) | ORAL | 0 refills | Status: AC

## 2023-03-21 MED ORDER — CEPHALEXIN 500 MG PO CAPS: 500.0000 mg | ORAL_CAPSULE | Freq: Four times a day (QID) | ORAL | 0 refills | Status: AC

## 2023-03-21 MED ORDER — ACETAMINOPHEN 325 MG PO TABS: 650.0000 mg | ORAL_TABLET | Freq: Four times a day (QID) | ORAL | 0 refills | Status: AC | PRN
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
## 2023-03-24 ENCOUNTER — Ambulatory Visit: Payer: Medicaid Other | Admitting: Urology

## 2023-03-25 ENCOUNTER — Encounter: Payer: Self-pay | Admitting: Urology

## 2023-04-14 ENCOUNTER — Other Ambulatory Visit: Payer: Self-pay

## 2023-04-14 ENCOUNTER — Emergency Department
Admission: EM | Admit: 2023-04-14 | Discharge: 2023-04-14 | Disposition: A | Discharge status: Home / Self Care | Payer: Medicaid Other | Attending: Emergency Medicine | Admitting: Emergency Medicine

## 2023-04-14 ENCOUNTER — Emergency Department: Payer: Medicaid Other

## 2023-04-14 DIAGNOSIS — R000 Tachycardia, unspecified: Secondary | ICD-10-CM | POA: Insufficient documentation

## 2023-04-14 DIAGNOSIS — M791 Myalgia, unspecified site: Secondary | ICD-10-CM | POA: Insufficient documentation

## 2023-04-14 DIAGNOSIS — N390 Urinary tract infection, site not specified: Secondary | ICD-10-CM | POA: Diagnosis not present

## 2023-04-14 DIAGNOSIS — R1084 Generalized abdominal pain: Secondary | ICD-10-CM

## 2023-04-14 DIAGNOSIS — R1013 Epigastric pain: Secondary | ICD-10-CM | POA: Diagnosis present

## 2023-04-14 DIAGNOSIS — Z1152 Encounter for screening for COVID-19: Secondary | ICD-10-CM | POA: Diagnosis not present

## 2023-04-14 LAB — CBC
HCT: 42.5 % (ref 39.0–52.0)
Hemoglobin: 15.4 g/dL (ref 13.0–17.0)
MCH: 32.6 pg (ref 26.0–34.0)
MCHC: 36.2 g/dL — ABNORMAL HIGH (ref 30.0–36.0)
MCV: 90 fL (ref 80.0–100.0)
Platelets: 200 10*3/uL (ref 150–400)
RBC: 4.72 MIL/uL (ref 4.22–5.81)
RDW: 12.1 % (ref 11.5–15.5)
WBC: 11.9 10*3/uL — ABNORMAL HIGH (ref 4.0–10.5)
nRBC: 0 % (ref 0.0–0.2)

## 2023-04-14 LAB — URINALYSIS, W/ REFLEX TO CULTURE (INFECTION SUSPECTED)
Bilirubin Urine: NEGATIVE
Glucose, UA: NEGATIVE mg/dL
Ketones, ur: 5 mg/dL — AB
Nitrite: NEGATIVE
Protein, ur: 30 mg/dL — AB
Specific Gravity, Urine: 1.021 (ref 1.005–1.030)
Squamous Epithelial / HPF: NONE SEEN /HPF (ref 0–5)
WBC, UA: >50 WBC/hpf (ref 0–5)
pH: 6 (ref 5.0–8.0)

## 2023-04-14 LAB — TROPONIN I (HIGH SENSITIVITY)
Troponin I (High Sensitivity): 4 ng/L (ref <18)
Troponin I (High Sensitivity): 4 ng/L (ref <18)

## 2023-04-14 LAB — COMPREHENSIVE METABOLIC PANEL
ALT: 25 U/L (ref 0–44)
AST: 20 U/L (ref 15–41)
Albumin: 3.4 g/dL — ABNORMAL LOW (ref 3.5–5.0)
Alkaline Phosphatase: 58 U/L (ref 38–126)
Anion gap: 12 (ref 5–15)
BUN: 9 mg/dL (ref 6–20)
CO2: 19 mmol/L — ABNORMAL LOW (ref 22–32)
Calcium: 8.7 mg/dL — ABNORMAL LOW (ref 8.9–10.3)
Chloride: 105 mmol/L (ref 98–111)
Creatinine, Ser: 0.96 mg/dL (ref 0.61–1.24)
GFR, Estimated: >60 mL/min (ref >60)
Glucose, Bld: 140 mg/dL — ABNORMAL HIGH (ref 70–99)
Potassium: 3.7 mmol/L (ref 3.5–5.1)
Sodium: 136 mmol/L (ref 135–145)
Total Bilirubin: 1.3 mg/dL — ABNORMAL HIGH (ref 0.3–1.2)
Total Protein: 7.4 g/dL (ref 6.5–8.1)

## 2023-04-14 LAB — SARS CORONAVIRUS 2 BY RT PCR: SARS Coronavirus 2 by RT PCR: NEGATIVE

## 2023-04-14 LAB — LACTIC ACID, PLASMA
Lactic Acid, Venous: 0.9 mmol/L (ref 0.5–1.9)
Lactic Acid, Venous: 1.2 mmol/L (ref 0.5–1.9)

## 2023-04-14 MED ORDER — HYDROMORPHONE HCL 1 MG/ML IJ SOLN: 1.0000 mg | Freq: Once | INTRAMUSCULAR | Status: DC

## 2023-04-14 MED ORDER — ACETAMINOPHEN 500 MG PO TABS
1000.0000 mg | ORAL_TABLET | Freq: Once | ORAL | Status: AC
  Administered 2023-04-14: 1000 mg via ORAL
  Filled 2023-04-14: qty 2

## 2023-04-14 MED ORDER — FENTANYL CITRATE PF 50 MCG/ML IJ SOSY
50.0000 ug | PREFILLED_SYRINGE | Freq: Once | INTRAMUSCULAR | Status: AC
  Administered 2023-04-14: 50 ug via INTRAVENOUS
  Filled 2023-04-14: qty 1

## 2023-04-14 MED ORDER — IOHEXOL 300 MG/ML  SOLN
100.0000 mL | Freq: Once | INTRAMUSCULAR | Status: AC | PRN
  Administered 2023-04-14: 100 mL via INTRAVENOUS

## 2023-04-14 MED ORDER — CEFDINIR 300 MG PO CAPS: 300.0000 mg | ORAL_CAPSULE | Freq: Two times a day (BID) | ORAL | 0 refills | Status: AC

## 2023-04-14 MED ORDER — SODIUM CHLORIDE 0.9 % IV SOLN
1.0000 g | Freq: Once | INTRAVENOUS | Status: AC
  Administered 2023-04-14: 1 g via INTRAVENOUS
  Filled 2023-04-14: qty 10

## 2023-04-14 MED ORDER — LACTATED RINGERS IV BOLUS
1000.0000 mL | Freq: Once | INTRAVENOUS | Status: AC
  Administered 2023-04-14: 1000 mL via INTRAVENOUS

## 2023-04-14 MED ORDER — SODIUM CHLORIDE 0.9 % IV BOLUS
1000.0000 mL | Freq: Once | INTRAVENOUS | Status: AC
  Administered 2023-04-14: 1000 mL via INTRAVENOUS

## 2023-04-14 MED ORDER — ONDANSETRON HCL 4 MG/2ML IJ SOLN
4.0000 mg | Freq: Four times a day (QID) | INTRAMUSCULAR | Status: DC | PRN
  Administered 2023-04-14: 4 mg via INTRAVENOUS
  Filled 2023-04-14: qty 2
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
## 2023-04-14 NOTE — Discharge Instructions (Addendum)
Please make sure you take all of the antibiotics as prescribed.  Call and schedule a follow up with your doctor.

## 2023-04-14 NOTE — ED Provider Notes (Signed)
  Physical Exam  BP (!) 130/95 (BP Location: Right Arm)   Pulse (!) 112   Temp (!) 102.8 F (39.3 C) (Oral)   Resp 16   Ht 6\' 3"  (1.905 m)   Wt 98 kg   SpO2 96%   BMI 27.00 kg/m   Physical Exam  Procedures  Procedures  ED Course / MDM    Medical Decision Making Amount and/or Complexity of Data Reviewed Labs: ordered. Radiology: ordered.  Risk OTC drugs. Prescription drug management.   Patient received from Dr. Vicente Males during shift change.  COVID results are pending, otherwise plan is to discharge him home.   Patient noted to have a temperature of 102.8 and tachycardic with a heart rate of 112.  IV Rocephin and second lactic ordered as well as LR. Covid test is negative.  Considered admission and discussed with patient who chooses to "just go on home." He states he is feeling better and does not want to be in the hospital "this time." We discussed ER return precautions.       Chinita Pester, FNP 04/15/23 1617    Merwyn Katos, MD 04/17/23 [phone]

## 2023-04-14 NOTE — ED Notes (Signed)
Pt verbalizes understanding of discharge instructions. Opportunity for questioning and answers were provided. Pt discharged from ED to home with friend.    

## 2023-04-14 NOTE — ED Triage Notes (Signed)
First nurse note:  Pt to ED [facility] from home generalized abd pain radiating to left shoulder started yesterday.  Temp 100.4 HR 110  Pt is BLIND

## 2023-04-21 ENCOUNTER — Emergency Department
Admission: EM | Admit: 2023-04-21 | Discharge: 2023-04-21 | Disposition: A | Discharge status: Home / Self Care | Payer: Medicaid Other | Attending: Emergency Medicine | Admitting: Emergency Medicine

## 2023-04-21 ENCOUNTER — Emergency Department: Payer: Medicaid Other

## 2023-04-21 ENCOUNTER — Other Ambulatory Visit: Payer: Self-pay

## 2023-04-21 ENCOUNTER — Encounter: Payer: Self-pay | Admitting: Urology

## 2023-04-21 ENCOUNTER — Ambulatory Visit: Payer: Medicaid Other | Admitting: Urology

## 2023-04-21 DIAGNOSIS — K859 Acute pancreatitis without necrosis or infection, unspecified: Secondary | ICD-10-CM

## 2023-04-21 DIAGNOSIS — E876 Hypokalemia: Secondary | ICD-10-CM | POA: Insufficient documentation

## 2023-04-21 DIAGNOSIS — R1084 Generalized abdominal pain: Secondary | ICD-10-CM | POA: Diagnosis present

## 2023-04-21 DIAGNOSIS — K8591 Acute pancreatitis with uninfected necrosis, unspecified: Secondary | ICD-10-CM | POA: Diagnosis not present

## 2023-04-21 LAB — COMPREHENSIVE METABOLIC PANEL
ALT: 15 U/L (ref 0–44)
AST: 10 U/L — ABNORMAL LOW (ref 15–41)
Albumin: 3.5 g/dL (ref 3.5–5.0)
Alkaline Phosphatase: 49 U/L (ref 38–126)
Anion gap: 10 (ref 5–15)
BUN: 7 mg/dL (ref 6–20)
CO2: 25 mmol/L (ref 22–32)
Calcium: 8.9 mg/dL (ref 8.9–10.3)
Chloride: 104 mmol/L (ref 98–111)
Creatinine, Ser: 0.96 mg/dL (ref 0.61–1.24)
GFR, Estimated: >60 mL/min (ref >60)
Glucose, Bld: 119 mg/dL — ABNORMAL HIGH (ref 70–99)
Potassium: 3.4 mmol/L — ABNORMAL LOW (ref 3.5–5.1)
Sodium: 139 mmol/L (ref 135–145)
Total Bilirubin: 0.5 mg/dL (ref 0.3–1.2)
Total Protein: 7.7 g/dL (ref 6.5–8.1)

## 2023-04-21 LAB — CBC WITH DIFFERENTIAL/PLATELET
Abs Immature Granulocytes: 0.03 10*3/uL (ref 0.00–0.07)
Basophils Absolute: 0 10*3/uL (ref 0.0–0.1)
Basophils Relative: 0 %
Eosinophils Absolute: 0.1 10*3/uL (ref 0.0–0.5)
Eosinophils Relative: 1 %
HCT: 41.7 % (ref 39.0–52.0)
Hemoglobin: 14.1 g/dL (ref 13.0–17.0)
Immature Granulocytes: 0 %
Lymphocytes Relative: 18 %
Lymphs Abs: 1.9 10*3/uL (ref 0.7–4.0)
MCH: 31.9 pg (ref 26.0–34.0)
MCHC: 33.8 g/dL (ref 30.0–36.0)
MCV: 94.3 fL (ref 80.0–100.0)
Monocytes Absolute: 0.4 10*3/uL (ref 0.1–1.0)
Monocytes Relative: 4 %
Neutro Abs: 8.1 10*3/uL — ABNORMAL HIGH (ref 1.7–7.7)
Neutrophils Relative %: 77 %
Platelets: 283 10*3/uL (ref 150–400)
RBC: 4.42 MIL/uL (ref 4.22–5.81)
RDW: 12.2 % (ref 11.5–15.5)
WBC: 10.5 10*3/uL (ref 4.0–10.5)
nRBC: 0 % (ref 0.0–0.2)

## 2023-04-21 LAB — LIPASE, BLOOD: Lipase: 150 U/L — ABNORMAL HIGH (ref 11–51)

## 2023-04-21 MED ORDER — OXYCODONE HCL 5 MG PO TABS: 5.0000 mg | ORAL_TABLET | Freq: Four times a day (QID) | ORAL | 0 refills | Status: AC | PRN

## 2023-04-21 MED ORDER — SODIUM CHLORIDE 0.9 % IV BOLUS
1000.0000 mL | Freq: Once | INTRAVENOUS | Status: AC
  Administered 2023-04-21: 1000 mL via INTRAVENOUS

## 2023-04-21 MED ORDER — DROPERIDOL 2.5 MG/ML IJ SOLN
1.2500 mg | Freq: Once | INTRAMUSCULAR | Status: AC
  Administered 2023-04-21: 1.25 mg via INTRAVENOUS
  Filled 2023-04-21: qty 2
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
## 2023-04-21 NOTE — Discharge Instructions (Addendum)
You are seen in the emergency department today for abdominal pain.  You are found to have mild case of pancreatitis which did improve with medication.  Please focus on a clear liquid diet initially with low-fat foods and then progress as you tolerate this. Drink slowly. If your abdominal pain severely worsens, please come back to the emergency department for further care.

## 2023-04-21 NOTE — ED Triage Notes (Signed)
Pt from home reports abdominal pain starting yesterday. Pt did have a known UTI and discharged on 8/1. Pt is blind in both eyes

## 2023-04-21 NOTE — ED Notes (Signed)
Pt given water to do PO  challenge. Pt was educated to sip water, and pt chugged the water. Pt was educated on where the call bell is located along with an emesis bag in case they need it.
